# Patient Record
Sex: Female | Born: 1960 | Race: White | Hispanic: No | Marital: Married | Smoking: Former smoker
Health system: Southern US, Community
[De-identification: ages and names within clinical notes are randomized; demographics above are authoritative.]

## PROBLEM LIST (undated history)

## (undated) DIAGNOSIS — K219 Gastro-esophageal reflux disease without esophagitis: Secondary | ICD-10-CM

## (undated) DIAGNOSIS — E7800 Pure hypercholesterolemia, unspecified: Secondary | ICD-10-CM

## (undated) DIAGNOSIS — E785 Hyperlipidemia, unspecified: Secondary | ICD-10-CM

## (undated) DIAGNOSIS — T7840XA Allergy, unspecified, initial encounter: Secondary | ICD-10-CM

## (undated) DIAGNOSIS — Z87440 Personal history of urinary (tract) infections: Secondary | ICD-10-CM

## (undated) DIAGNOSIS — R011 Cardiac murmur, unspecified: Secondary | ICD-10-CM

## (undated) DIAGNOSIS — M1710 Unilateral primary osteoarthritis, unspecified knee: Secondary | ICD-10-CM

## (undated) DIAGNOSIS — M810 Age-related osteoporosis without current pathological fracture: Secondary | ICD-10-CM

## (undated) DIAGNOSIS — Z86010 Personal history of colonic polyps: Secondary | ICD-10-CM

## (undated) DIAGNOSIS — N63 Unspecified lump in unspecified breast: Secondary | ICD-10-CM

## (undated) DIAGNOSIS — Z8619 Personal history of other infectious and parasitic diseases: Secondary | ICD-10-CM

## (undated) DIAGNOSIS — M179 Osteoarthritis of knee, unspecified: Secondary | ICD-10-CM

## (undated) HISTORY — DX: Cardiac murmur, unspecified: R01.1

## (undated) HISTORY — DX: Hyperlipidemia, unspecified: E78.5

## (undated) HISTORY — DX: Unspecified lump in unspecified breast: N63.0

## (undated) HISTORY — DX: Gastro-esophageal reflux disease without esophagitis: K21.9

## (undated) HISTORY — DX: Personal history of colonic polyps: Z86.010

## (undated) HISTORY — DX: Pure hypercholesterolemia, unspecified: E78.00

## (undated) HISTORY — DX: Allergy, unspecified, initial encounter: T78.40XA

## (undated) HISTORY — DX: Personal history of other infectious and parasitic diseases: Z86.19

## (undated) HISTORY — DX: Osteoarthritis of knee, unspecified: M17.9

## (undated) HISTORY — DX: Personal history of urinary (tract) infections: Z87.440

## (undated) HISTORY — DX: Unilateral primary osteoarthritis, unspecified knee: M17.10

---
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
## 2000-01-12 ENCOUNTER — Encounter: Payer: Self-pay | Admitting: Sports Medicine

## 2000-01-12 ENCOUNTER — Encounter: Admission: RE | Admit: 2000-01-12 | Discharge: 2000-01-12 | Payer: Self-pay | Admitting: Sports Medicine

## 2000-01-24 ENCOUNTER — Ambulatory Visit (HOSPITAL_COMMUNITY): Admission: RE | Admit: 2000-01-24 | Discharge: 2000-01-24 | Payer: Self-pay | Admitting: Sports Medicine

## 2000-01-24 ENCOUNTER — Encounter: Payer: Self-pay | Admitting: Sports Medicine

## 2000-01-27 ENCOUNTER — Encounter: Admission: RE | Admit: 2000-01-27 | Discharge: 2000-01-27 | Payer: Self-pay | Admitting: Family Medicine

## 2000-10-20 ENCOUNTER — Encounter: Admission: RE | Admit: 2000-10-20 | Discharge: 2000-10-20 | Payer: Self-pay | Admitting: Sports Medicine

## 2001-10-03 ENCOUNTER — Other Ambulatory Visit: Admission: RE | Admit: 2001-10-03 | Discharge: 2001-10-03 | Payer: Self-pay | Admitting: Obstetrics and Gynecology

## 2002-03-19 ENCOUNTER — Encounter: Admission: RE | Admit: 2002-03-19 | Discharge: 2002-03-19 | Payer: Self-pay | Admitting: Family Medicine

## 2002-06-12 ENCOUNTER — Other Ambulatory Visit: Admission: RE | Admit: 2002-06-12 | Discharge: 2002-06-12 | Payer: Self-pay | Admitting: Obstetrics & Gynecology

## 2003-08-01 ENCOUNTER — Encounter: Admission: RE | Admit: 2003-08-01 | Discharge: 2003-08-01 | Payer: Self-pay | Admitting: Sports Medicine

## 2003-08-13 ENCOUNTER — Other Ambulatory Visit: Admission: RE | Admit: 2003-08-13 | Discharge: 2003-08-13 | Payer: Self-pay | Admitting: Obstetrics and Gynecology

## 2004-10-08 ENCOUNTER — Ambulatory Visit (HOSPITAL_COMMUNITY): Admission: RE | Admit: 2004-10-08 | Discharge: 2004-10-08 | Payer: Self-pay | Admitting: Obstetrics and Gynecology

## 2005-10-10 ENCOUNTER — Ambulatory Visit (HOSPITAL_COMMUNITY): Admission: RE | Admit: 2005-10-10 | Discharge: 2005-10-10 | Payer: Self-pay | Admitting: Obstetrics and Gynecology

## 2005-11-04 ENCOUNTER — Ambulatory Visit: Payer: Self-pay | Admitting: Sports Medicine

## 2006-02-22 ENCOUNTER — Encounter: Admission: RE | Admit: 2006-02-22 | Discharge: 2006-02-22 | Payer: Self-pay | Admitting: Obstetrics and Gynecology

## 2006-10-11 ENCOUNTER — Encounter: Admission: RE | Admit: 2006-10-11 | Discharge: 2006-10-11 | Payer: Self-pay | Admitting: Obstetrics and Gynecology

## 2007-03-26 ENCOUNTER — Ambulatory Visit (HOSPITAL_COMMUNITY): Admission: RE | Admit: 2007-03-26 | Discharge: 2007-03-26 | Payer: Self-pay | Admitting: Family Medicine

## 2007-03-26 ENCOUNTER — Encounter: Payer: Self-pay | Admitting: Sports Medicine

## 2007-10-16 ENCOUNTER — Encounter: Admission: RE | Admit: 2007-10-16 | Discharge: 2007-10-16 | Payer: Self-pay | Admitting: Obstetrics and Gynecology

## 2007-12-18 ENCOUNTER — Encounter: Admission: RE | Admit: 2007-12-18 | Discharge: 2007-12-18 | Payer: Self-pay | Admitting: Obstetrics and Gynecology

## 2008-01-24 ENCOUNTER — Encounter: Payer: Self-pay | Admitting: Family Medicine

## 2008-03-20 ENCOUNTER — Encounter: Payer: Self-pay | Admitting: Sports Medicine

## 2008-10-20 ENCOUNTER — Encounter: Admission: RE | Admit: 2008-10-20 | Discharge: 2008-10-20 | Payer: Self-pay | Admitting: Obstetrics and Gynecology

## 2009-02-04 ENCOUNTER — Encounter: Payer: Self-pay | Admitting: Family Medicine

## 2009-02-04 DIAGNOSIS — N63 Unspecified lump in unspecified breast: Secondary | ICD-10-CM | POA: Insufficient documentation

## 2009-03-30 ENCOUNTER — Encounter: Payer: Self-pay | Admitting: *Deleted

## 2009-04-09 ENCOUNTER — Encounter: Payer: Self-pay | Admitting: Family Medicine

## 2009-04-09 ENCOUNTER — Encounter: Admission: RE | Admit: 2009-04-09 | Discharge: 2009-04-09 | Payer: Self-pay | Admitting: Family Medicine

## 2009-04-30 ENCOUNTER — Ambulatory Visit: Payer: Self-pay | Admitting: Family Medicine

## 2009-05-25 ENCOUNTER — Ambulatory Visit: Payer: Self-pay | Admitting: Family Medicine

## 2009-05-26 ENCOUNTER — Ambulatory Visit (HOSPITAL_COMMUNITY): Admission: RE | Admit: 2009-05-26 | Discharge: 2009-05-26 | Payer: Self-pay | Admitting: Family Medicine

## 2009-05-27 ENCOUNTER — Encounter: Payer: Self-pay | Admitting: Family Medicine

## 2009-06-02 ENCOUNTER — Ambulatory Visit (HOSPITAL_COMMUNITY): Admission: RE | Admit: 2009-06-02 | Discharge: 2009-06-02 | Payer: Self-pay | Admitting: Family Medicine

## 2009-06-18 ENCOUNTER — Encounter: Payer: Self-pay | Admitting: Family Medicine

## 2009-09-25 ENCOUNTER — Encounter: Payer: Self-pay | Admitting: Family Medicine

## 2009-10-20 ENCOUNTER — Encounter: Payer: Self-pay | Admitting: Family Medicine

## 2009-11-04 ENCOUNTER — Encounter: Payer: Self-pay | Admitting: Family Medicine

## 2009-11-04 ENCOUNTER — Encounter: Admission: RE | Admit: 2009-11-04 | Discharge: 2009-11-04 | Payer: Self-pay | Admitting: Obstetrics and Gynecology

## 2010-06-25 ENCOUNTER — Encounter: Payer: Self-pay | Admitting: *Deleted

## 2010-06-30 ENCOUNTER — Encounter: Admission: RE | Admit: 2010-06-30 | Discharge: 2010-06-30 | Payer: Self-pay | Admitting: Family Medicine

## 2010-12-28 ENCOUNTER — Encounter
Admission: RE | Admit: 2010-12-28 | Discharge: 2010-12-28 | Payer: Self-pay | Source: Home / Self Care | Attending: Obstetrics and Gynecology | Admitting: Obstetrics and Gynecology

## 2010-12-29 ENCOUNTER — Encounter: Payer: Self-pay | Admitting: Family Medicine

## 2011-01-09 ENCOUNTER — Encounter: Payer: Self-pay | Admitting: Sports Medicine

## 2011-01-20 NOTE — Miscellaneous (Signed)
Summary: mammogram report (negative) entered.  Clinical Lists Changes  Observations: Added new observation of DM PROGRESS: N/A (12/29/2010 10:18) Added new observation of DM FSREVIEW: N/A (12/29/2010 10:18) Added new observation of HTN PROGRESS: N/A (12/29/2010 10:18) Added new observation of HTN FSREVIEW: N/A (12/29/2010 10:18) Added new observation of LIPID PROGRS: N/A (12/29/2010 10:18) Added new observation of LIPID FSREVW: N/A (12/29/2010 10:18) Added new observation of MAMMOGRAM: No specific mammographic evidence of malignancy.  Assessment: BIRADS 1.  (12/28/2010 10:19)      Mammogram  Procedure date:  12/28/2010  Findings:      No specific mammographic evidence of malignancy.  Assessment: BIRADS 1.     Prevention & Chronic Care Immunizations   Influenza vaccine: Not documented    Tetanus booster: 04/30/2009: Tdap    Pneumococcal vaccine: Not documented  Colorectal Screening   Hemoccult: Not documented    Colonoscopy: Not documented  Other Screening   Pap smear: Not documented    Mammogram: No specific mammographic evidence of malignancy.  Assessment: BIRADS 1.   (12/28/2010)   Smoking status: Not documented  Lipids   Total Cholesterol: Not documented   LDL: Not documented   LDL Direct: Not documented   HDL: Not documented   Triglycerides: Not documented

## 2011-01-20 NOTE — Miscellaneous (Signed)
Summary: orders  Clinical Lists Changes Pt noted 2 small cysts L breasts that have persisted.  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  No breast discharge.  + Occ pain at area of cysts with frequent working out and lifting. PE: Well noursished, well developed.  No distress R breast with diffuse fibrocystic type changes as noted before.  + 0.5 cm firm nodule approx 2 cm from nipple in 1 o'clock position.  Second nodule noted in same breast 1cm from nipple in 11 o'clock position.  A/P: cystic appearing nodules in pt with multiple episodes of breast imaging.  Discussed options with pt, and we will proceed with imaging.  Dx mammogram and ultrasound if indicated. Sarah Swaziland MD  June 25, 2010 1:52 PM  Orders: Added new Test order of Mammogram (Diagnostic) (Mammo) - Signed

## 2011-06-23 ENCOUNTER — Other Ambulatory Visit: Payer: Self-pay | Admitting: Family Medicine

## 2011-06-23 DIAGNOSIS — K219 Gastro-esophageal reflux disease without esophagitis: Secondary | ICD-10-CM

## 2011-06-23 MED ORDER — ESOMEPRAZOLE MAGNESIUM 40 MG PO CPDR: 40.0000 mg | DELAYED_RELEASE_CAPSULE | Freq: Every day | ORAL | Status: DC

## 2011-10-04 ENCOUNTER — Other Ambulatory Visit: Payer: Self-pay | Admitting: Family Medicine

## 2011-10-04 DIAGNOSIS — K219 Gastro-esophageal reflux disease without esophagitis: Secondary | ICD-10-CM

## 2011-10-04 MED ORDER — ESOMEPRAZOLE MAGNESIUM 40 MG PO CPDR: 40.0000 mg | DELAYED_RELEASE_CAPSULE | Freq: Every day | ORAL | Status: DC

## 2011-10-04 MED ORDER — OLANZAPINE 5 MG PO TABS: 5.0000 mg | ORAL_TABLET | Freq: Every day | ORAL | Status: DC

## 2011-10-04 MED ORDER — FLUOXETINE HCL 20 MG PO CAPS: 20.0000 mg | ORAL_CAPSULE | Freq: Every day | ORAL | Status: DC

## 2011-10-06 ENCOUNTER — Other Ambulatory Visit: Payer: Self-pay | Admitting: Family Medicine

## 2011-10-06 DIAGNOSIS — R5383 Other fatigue: Secondary | ICD-10-CM

## 2011-10-07 LAB — TSH: TSH: 3.915 u[IU]/mL (ref 0.350–4.500)

## 2011-10-11 LAB — TSH: TSH: 3.915 u[IU]/mL (ref 0.350–4.500)

## 2011-10-31 ENCOUNTER — Other Ambulatory Visit: Payer: Self-pay | Admitting: Family Medicine

## 2011-10-31 MED ORDER — FLUOXETINE HCL 20 MG PO CAPS: 20.0000 mg | ORAL_CAPSULE | Freq: Every day | ORAL | Status: DC

## 2011-10-31 MED ORDER — OLANZAPINE 5 MG PO TABS: 5.0000 mg | ORAL_TABLET | Freq: Every day | ORAL | Status: DC

## 2011-12-02 ENCOUNTER — Ambulatory Visit (INDEPENDENT_AMBULATORY_CARE_PROVIDER_SITE_OTHER): Payer: 59 | Admitting: Family Medicine

## 2011-12-02 DIAGNOSIS — J309 Allergic rhinitis, unspecified: Secondary | ICD-10-CM

## 2011-12-02 DIAGNOSIS — J329 Chronic sinusitis, unspecified: Secondary | ICD-10-CM

## 2011-12-02 MED ORDER — FLUTICASONE PROPIONATE 50 MCG/ACT NA SUSP: 2.0000 | Freq: Every day | NASAL | Status: DC

## 2011-12-02 MED ORDER — CETIRIZINE HCL 10 MG PO CAPS: 10.0000 mg | ORAL_CAPSULE | Freq: Every day | ORAL | Status: DC

## 2011-12-02 NOTE — Patient Instructions (Signed)
Allergic Rhinitis Allergic rhinitis is when the mucous membranes in the nose respond to allergens. Allergens are particles in the air that cause your body to have an allergic reaction. This causes you to release allergic antibodies. Through a chain of events, these eventually cause you to release histamine into the blood stream (hence the use of antihistamines). Although meant to be protective to the body, it is this release that causes your discomfort, such as frequent sneezing, congestion and an itchy runny nose.  CAUSES  The pollen allergens may come from grasses, trees, and weeds. This is seasonal allergic rhinitis, or "hay fever." Other allergens cause year-round allergic rhinitis (perennial allergic rhinitis) such as house dust mite allergen, pet dander and mold spores.  SYMPTOMS   Nasal stuffiness (congestion).   Runny, itchy nose with sneezing and tearing of the eyes.   There is often an itching of the mouth, eyes and ears.  It cannot be cured, but it can be controlled with medications. DIAGNOSIS  If you are unable to determine the offending allergen, skin or blood testing may find it. TREATMENT   Avoid the allergen.   Medications and allergy shots (immunotherapy) can help.   Hay fever may often be treated with antihistamines in pill or nasal spray forms. Antihistamines block the effects of histamine. There are over-the-counter medicines that may help with nasal congestion and swelling around the eyes. Check with your caregiver before taking or giving this medicine.  If the treatment above does not work, there are many new medications your caregiver can prescribe. Stronger medications may be used if initial measures are ineffective. Desensitizing injections can be used if medications and avoidance fails. Desensitization is when a patient is given ongoing shots until the body becomes less sensitive to the allergen. Make sure you follow up with your caregiver if problems continue. SEEK  MEDICAL CARE IF:   You develop fever (more than 100.5 F (38.1 C).   You develop a cough that does not stop easily (persistent).   You have shortness of breath.   You start wheezing.   Symptoms interfere with normal daily activities.  Document Released: 08/30/2001 Document Revised: 08/17/2011 Document Reviewed: 03/11/2009 [employer] Patient Information 2012 [employer]. 

## 2011-12-03 DIAGNOSIS — J329 Chronic sinusitis, unspecified: Secondary | ICD-10-CM | POA: Insufficient documentation

## 2011-12-03 HISTORY — DX: Chronic sinusitis, unspecified: J32.9

## 2011-12-03 NOTE — Progress Notes (Signed)
  Subjective:    Patient ID: Allison Calderon, female    DOB: 08-20-1961, 50 y.o.   MRN: [id number]  HPI Sinus congestion x 1 week. No fevers. Mild rhinorrhea and post nasal drip. Pt states that she has had episodes of this before in the past and this has been relieved with flonase. Pt states that she has also noticed increased snoring and difficulty breathing through nose, especially at night over this same time frame. No hx/o asthma. Non smoker. Pt report hx/o seasonal allergies. 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 No generalized malaise or fatigue.    Review of Systems See HPI, otherwise ROS, negative.     Objective:   Physical Exam Gen: up in chair, NAD HEENT: NCAT, EOMI, TMs clear bilaterally, +nasal erythema and mild turbinate swelling  bilaterally, + post mild oropharyngeal erythema  CV: RRR, no murmurs auscultated PULM: CTAB, no wheezes, rales, rhoncii   Assessment & Plan:

## 2011-12-03 NOTE — Assessment & Plan Note (Signed)
Will start pt on flonase and zyrtec for this as there seems to be an allergic component to sxs. Likely nidus for this may have been a mild virus.  Handout given. Will follow prn.

## 2012-01-02 ENCOUNTER — Other Ambulatory Visit: Payer: Self-pay | Admitting: Family Medicine

## 2012-01-02 DIAGNOSIS — K219 Gastro-esophageal reflux disease without esophagitis: Secondary | ICD-10-CM

## 2012-01-02 MED ORDER — ESOMEPRAZOLE MAGNESIUM 40 MG PO CPDR: 40.0000 mg | DELAYED_RELEASE_CAPSULE | Freq: Every day | ORAL | Status: DC

## 2012-01-23 ENCOUNTER — Other Ambulatory Visit: Payer: Self-pay | Admitting: Family Medicine

## 2012-01-23 DIAGNOSIS — Z1231 Encounter for screening mammogram for malignant neoplasm of breast: Secondary | ICD-10-CM

## 2012-02-06 ENCOUNTER — Other Ambulatory Visit: Payer: Self-pay | Admitting: Family Medicine

## 2012-02-06 MED ORDER — FLUOXETINE HCL 20 MG PO CAPS: 20.0000 mg | ORAL_CAPSULE | Freq: Every day | ORAL | Status: DC

## 2012-02-06 MED ORDER — OLANZAPINE 5 MG PO TABS: 5.0000 mg | ORAL_TABLET | Freq: Every day | ORAL | Status: DC

## 2012-02-07 ENCOUNTER — Ambulatory Visit
Admission: RE | Admit: 2012-02-07 | Discharge: 2012-02-07 | Disposition: A | Discharge status: Home / Self Care | Payer: 59 | Source: Ambulatory Visit | Attending: Family Medicine | Admitting: Family Medicine

## 2012-02-07 DIAGNOSIS — Z1231 Encounter for screening mammogram for malignant neoplasm of breast: Secondary | ICD-10-CM

## 2012-03-07 ENCOUNTER — Other Ambulatory Visit: Payer: Self-pay | Admitting: Family Medicine

## 2012-05-17 ENCOUNTER — Other Ambulatory Visit (INDEPENDENT_AMBULATORY_CARE_PROVIDER_SITE_OTHER): Payer: 59 | Admitting: Family Medicine

## 2012-05-17 ENCOUNTER — Other Ambulatory Visit: Payer: Self-pay | Admitting: Family Medicine

## 2012-05-17 DIAGNOSIS — N390 Urinary tract infection, site not specified: Secondary | ICD-10-CM

## 2012-05-17 DIAGNOSIS — R300 Dysuria: Secondary | ICD-10-CM

## 2012-05-17 LAB — POCT URINALYSIS DIPSTICK
Bilirubin, UA: NEGATIVE
Glucose, UA: NEGATIVE
Nitrite, UA: NEGATIVE
Urobilinogen, UA: 0.2

## 2012-05-17 LAB — POCT UA - MICROSCOPIC ONLY: RBC, urine, microscopic: >20

## 2012-05-17 MED ORDER — CEPHALEXIN 500 MG PO TABS: 500.0000 mg | ORAL_TABLET | Freq: Two times a day (BID) | ORAL | Status: AC

## 2012-05-17 NOTE — Progress Notes (Unsigned)
Urine & microscopic done, urine sent for culture, per Dr. Domenica Reamer, MLS

## 2012-05-17 NOTE — Progress Notes (Signed)
UTI sx since yesterday. Has these once or twice a year---usu resolve w fluids alone. Has very occ had blood tinged urine and she is having that now. No fever, no nausea, no back or abdominal pain. + urinary frequency and small amounts, blood tinged.  Will do UA, cx, start keflex 7 days.

## 2012-05-19 LAB — URINE CULTURE: Organism ID, Bacteria: NO GROWTH

## 2012-12-14 ENCOUNTER — Encounter: Payer: Self-pay | Admitting: Internal Medicine

## 2012-12-14 ENCOUNTER — Ambulatory Visit (INDEPENDENT_AMBULATORY_CARE_PROVIDER_SITE_OTHER): Payer: 59 | Admitting: Internal Medicine

## 2012-12-14 VITALS — BP 112/82 | HR 75 | Temp 98.0°F | Ht 62.0 in | Wt 128.1 lb

## 2012-12-14 DIAGNOSIS — Z0000 Encounter for general adult medical examination without abnormal findings: Secondary | ICD-10-CM

## 2012-12-14 DIAGNOSIS — Z1211 Encounter for screening for malignant neoplasm of colon: Secondary | ICD-10-CM

## 2012-12-14 NOTE — Progress Notes (Signed)
  Subjective:    Patient ID: Allison Calderon, female    DOB: 1961-10-06, 51 y.o.   MRN: [id number]  HPI  patient is here today for annual physical. Patient feels well and has no complaints. Past Medical History  Diagnosis Date  . BREAST LUMP   . History of chicken pox   . GERD (gastroesophageal reflux disease)   . Allergy   . Hx: UTI (urinary tract infection)    Family History  Problem Relation Age of Onset  . Arthritis Mother   . Diabetes Mother   . Hyperlipidemia Father   . Heart disease Father     MI @ 36  . Hypertension Paternal Grandfather    History  Substance Use Topics  . Smoking status: Former Smoker -- 30 years    Types: Cigarettes  . Smokeless tobacco: Never Used  . Alcohol Use: Yes    Review of Systems Constitutional: Negative for fever or weight change.  Respiratory: Negative for cough and shortness of breath.   Cardiovascular: Negative for chest pain or palpitations.  Gastrointestinal: Negative for abdominal pain, no bowel changes.  Musculoskeletal: Negative for gait problem or joint swelling.  Skin: Negative for rash.  Neurological: Negative for dizziness or headache.  No other specific complaints in a complete review of systems (except as listed in HPI above).     Objective:   Physical Exam BP 112/82  Pulse 75  Temp 98 F (36.7 C) (Oral)  Ht 5\' 2"  (1.575 m)  Wt 128 lb 1.9 oz (58.115 kg)  BMI 23.43 kg/m2  SpO2 98% Wt Readings from Last 3 Encounters:  12/14/12 128 lb 1.9 oz (58.115 kg)  12/02/11 122 lb (55.339 kg)  05/25/09 125 lb (56.7 kg)   Constitutional: She appears well-developed and well-nourished. No distress.  HENT: Head: Normocephalic and atraumatic. Ears: B TMs ok, no erythema or effusion; Nose: Nose normal.  Mouth/Throat: Oropharynx is clear and moist. No oropharyngeal exudate.  Eyes: Conjunctivae and EOM are normal. Pupils are equal, round, and reactive to light. No scleral icterus.  Neck: Normal range of motion. Neck supple. No JVD  present. No thyromegaly present.  Cardiovascular: Normal rate, regular rhythm and normal heart sounds.  No murmur heard. No BLE edema. Pulmonary/Chest: Effort normal and breath sounds normal. No respiratory distress. She has no wheezes.  Abdominal: Soft. Bowel sounds are normal. She exhibits no distension. There is no tenderness. no masses Musculoskeletal: Normal range of motion, no joint effusions. No gross deformities Neurological: She is alert and oriented to person, place, and time. No cranial nerve deficit. Coordination normal.  Skin: Skin is warm and dry. No rash noted. No erythema.  Psychiatric: She has a normal mood and affect. Her behavior is normal. Judgment and thought content normal.   Lab Results  Component Value Date   TSH 3.915 10/06/2011   TSH 3.915 10/06/2011        Assessment & Plan:  CPx/v70.0 - Patient has been counseled on age-appropriate routine health concerns for screening and prevention. These are reviewed and up-to-date. Immunizations are up-to-date or declined. Labs orderd and reviewed.

## 2012-12-14 NOTE — Patient Instructions (Addendum)
It was good to see you today. Health Maintenance reviewed - all recommended immunizations and age-appropriate screenings are up-to-date. we'll make referral to GI for screening colonoscopy . Our office will contact you regarding appointment(s) once made. Test(s) ordered today. Your results will be released to MyChart (or called to you) after review, usually within 72hours after test completion. If any changes need to be made, you will be notified at that same time. Medications reviewed, no changes at this time.  Please schedule followup in 1 year for medical physical, call sooner if problems. Health Maintenance, Females A healthy lifestyle and preventative care can promote health and wellness.  Maintain regular health, dental, and eye exams.   Eat a healthy diet. Foods like vegetables, fruits, whole grains, low-fat dairy products, and lean protein foods contain the nutrients you need without too many calories. Decrease your intake of foods high in solid fats, added sugars, and salt. Get information about a proper diet from your caregiver, if necessary.   Regular physical exercise is one of the most important things you can do for your health. Most adults should get at least 150 minutes of moderate-intensity exercise (any activity that increases your heart rate and causes you to sweat) each week. In addition, most adults need muscle-strengthening exercises on 2 or more days a week.     Maintain a healthy weight. The body mass index (BMI) is a screening tool to identify possible weight problems. It provides an estimate of body fat based on height and weight. Your caregiver can help determine your BMI, and can help you achieve or maintain a healthy weight. For adults 20 years and older:   A BMI below 18.5 is considered underweight.   A BMI of 18.5 to 24.9 is normal.   A BMI of 25 to 29.9 is considered overweight.   A BMI of 30 and above is considered obese.   Maintain normal blood lipids and  cholesterol by exercising and minimizing your intake of saturated fat. Eat a balanced diet with plenty of fruits and vegetables. Blood tests for lipids and cholesterol should begin at age 17 and be repeated every 5 years. If your lipid or cholesterol levels are high, you are over 50, or you are a high risk for heart disease, you may need your cholesterol levels checked more frequently. Ongoing high lipid and cholesterol levels should be treated with medicines if diet and exercise are not effective.   If you smoke, find out from your caregiver how to quit. If you do not use tobacco, do not start.   If you are pregnant, do not drink alcohol. If you are breastfeeding, be very cautious about drinking alcohol. If you are not pregnant and choose to drink alcohol, do not exceed 1 drink per day. One drink is considered to be 12 ounces (355 mL) of beer, 5 ounces (148 mL) of wine, or 1.5 ounces (44 mL) of liquor.   Avoid use of street drugs. Do not share needles with anyone. Ask for help if you need support or instructions about stopping the use of drugs.   High blood pressure causes heart disease and increases the risk of stroke. Blood pressure should be checked at least every 1 to 2 years. Ongoing high blood pressure should be treated with medicines, if weight loss and exercise are not effective.   If you are 35 to 53 years old, ask your caregiver if you should take aspirin to prevent strokes.   Diabetes screening involves taking a  blood sample to check your fasting blood sugar level. This should be done once every 3 years, after age 17, if you are within normal weight and without risk factors for diabetes. Testing should be considered at a younger age or be carried out more frequently if you are overweight and have at least 1 risk factor for diabetes.   Breast cancer screening is essential preventative care for women. You should practice "breast self-awareness." This means understanding the normal appearance  and feel of your breasts and may include breast self-examination. Any changes detected, no matter how small, should be reported to a caregiver. Women in their 55s and 30s should have a clinical breast exam (CBE) by a caregiver as part of a regular health exam every 1 to 3 years. After age 30, women should have a CBE every year. Starting at age 73, women should consider having a mammogram (breast X-ray) every year. Women who have a family history of breast cancer should talk to their caregiver about genetic screening. Women at a high risk of breast cancer should talk to their caregiver about having an MRI and a mammogram every year.   The Pap test is a screening test for cervical cancer. Women should have a Pap test starting at age 68. Between ages 11 and 74, Pap tests should be repeated every 2 years. Beginning at age 7, you should have a Pap test every 3 years as long as the past 3 Pap tests have been normal. If you had a hysterectomy for a problem that was not cancer or a condition that could lead to cancer, then you no longer need Pap tests. If you are between ages 78 and 25, and you have had normal Pap tests going back 10 years, you no longer need Pap tests. If you have had past treatment for cervical cancer or a condition that could lead to cancer, you need Pap tests and screening for cancer for at least 20 years after your treatment. If Pap tests have been discontinued, risk factors (such as a new sexual partner) need to be reassessed to determine if screening should be resumed. Some women have medical problems that increase the chance of getting cervical cancer. In these cases, your caregiver may recommend more frequent screening and Pap tests.   The human papillomavirus (HPV) test is an additional test that may be used for cervical cancer screening. The HPV test looks for the virus that can cause the cell changes on the cervix. The cells collected during the Pap test can be tested for HPV. The HPV test  could be used to screen women aged 15 years and older, and should be used in women of any age who have unclear Pap test results. After the age of 17, women should have HPV testing at the same frequency as a Pap test.   Colorectal cancer can be detected and often prevented. Most routine colorectal cancer screening begins at the age of 79 and continues through age 57. However, your caregiver may recommend screening at an earlier age if you have risk factors for colon cancer. On a yearly basis, your caregiver may provide home test kits to check for hidden blood in the stool. Use of a small camera at the end of a tube, to directly examine the colon (sigmoidoscopy or colonoscopy), can detect the earliest forms of colorectal cancer. Talk to your caregiver about this at age 20, when routine screening begins. Direct examination of the colon should be repeated every 5 to  10 years through age 48, unless early forms of pre-cancerous polyps or small growths are found.   Hepatitis C blood testing is recommended for all people born from [birth year] through 1965 and any individual with known risks for hepatitis C.   Practice safe sex. Use condoms and avoid high-risk sexual practices to reduce the spread of sexually transmitted infections (STIs). Sexually active women aged 20 and younger should be checked for Chlamydia, which is a common sexually transmitted infection. Older women with new or multiple partners should also be tested for Chlamydia. Testing for other STIs is recommended if you are sexually active and at increased risk.   Osteoporosis is a disease in which the bones lose minerals and strength with aging. This can result in serious bone fractures. The risk of osteoporosis can be identified using a bone density scan. Women ages 32 and over and women at risk for fractures or osteoporosis should discuss screening with their caregivers. Ask your caregiver whether you should be taking a calcium supplement or vitamin D to  reduce the rate of osteoporosis.   Menopause can be associated with physical symptoms and risks. Hormone replacement therapy is available to decrease symptoms and risks. You should talk to your caregiver about whether hormone replacement therapy is right for you.   Use sunscreen with a sun protection factor (SPF) of 30 or greater. Apply sunscreen liberally and repeatedly throughout the day. You should seek shade when your shadow is shorter than you. Protect yourself by wearing long sleeves, pants, a wide-brimmed hat, and sunglasses year round, whenever you are outdoors.   Notify your caregiver of new moles or changes in moles, especially if there is a change in shape or color. Also notify your caregiver if a mole is larger than the size of a pencil eraser.   Stay current with your immunizations.  Document Released: 06/20/2011 Document Revised: 02/27/2012 Document Reviewed: 06/20/2011 [facility] Patient Information 2013 [place], [place].

## 2013-01-16 ENCOUNTER — Encounter: Payer: Self-pay | Admitting: Internal Medicine

## 2013-01-16 ENCOUNTER — Other Ambulatory Visit: Payer: Self-pay | Admitting: Internal Medicine

## 2013-01-16 DIAGNOSIS — Z1231 Encounter for screening mammogram for malignant neoplasm of breast: Secondary | ICD-10-CM

## 2013-01-18 ENCOUNTER — Encounter: Payer: Self-pay | Admitting: Internal Medicine

## 2013-01-18 ENCOUNTER — Other Ambulatory Visit (INDEPENDENT_AMBULATORY_CARE_PROVIDER_SITE_OTHER): Payer: 59

## 2013-01-18 DIAGNOSIS — Z0000 Encounter for general adult medical examination without abnormal findings: Secondary | ICD-10-CM

## 2013-01-18 LAB — URINALYSIS, ROUTINE W REFLEX MICROSCOPIC
Bilirubin Urine: NEGATIVE
Ketones, ur: NEGATIVE
Leukocytes, UA: NEGATIVE
Total Protein, Urine: NEGATIVE
Urine Glucose: NEGATIVE
pH: 6 (ref 5.0–8.0)

## 2013-01-18 LAB — LIPID PANEL
HDL: 80.7 mg/dL (ref >39.00)
Total CHOL/HDL Ratio: 3
Triglycerides: 70 mg/dL (ref 0.0–149.0)
VLDL: 14 mg/dL (ref 0.0–40.0)

## 2013-01-18 LAB — BASIC METABOLIC PANEL
CO2: 25 mEq/L (ref 19–32)
GFR: 76.32 mL/min (ref >60.00)
Glucose, Bld: 102 mg/dL — ABNORMAL HIGH (ref 70–99)
Potassium: 4.5 mEq/L (ref 3.5–5.1)
Sodium: 137 mEq/L (ref 135–145)

## 2013-01-18 LAB — CBC WITH DIFFERENTIAL/PLATELET
Basophils Absolute: 0.1 10*3/uL (ref 0.0–0.1)
HCT: 39.7 % (ref 36.0–46.0)
Hemoglobin: 13.6 g/dL (ref 12.0–15.0)
Lymphs Abs: 1.9 10*3/uL (ref 0.7–4.0)
Monocytes Relative: 10.2 % (ref 3.0–12.0)
Neutro Abs: 3.8 10*3/uL (ref 1.4–7.7)
RDW: 12.7 % (ref 11.5–14.6)

## 2013-01-18 LAB — HEPATIC FUNCTION PANEL: Total Bilirubin: 1.2 mg/dL (ref 0.3–1.2)

## 2013-01-18 LAB — LDL CHOLESTEROL, DIRECT: Direct LDL: 135.9 mg/dL

## 2013-02-11 ENCOUNTER — Ambulatory Visit
Admission: RE | Admit: 2013-02-11 | Discharge: 2013-02-11 | Disposition: A | Discharge status: Home / Self Care | Payer: 59 | Source: Ambulatory Visit | Attending: Internal Medicine | Admitting: Internal Medicine

## 2013-02-11 DIAGNOSIS — Z1231 Encounter for screening mammogram for malignant neoplasm of breast: Secondary | ICD-10-CM

## 2013-02-14 ENCOUNTER — Encounter: Payer: 59 | Admitting: Internal Medicine

## 2013-02-16 HISTORY — PX: COLONOSCOPY: SHX174

## 2013-02-18 ENCOUNTER — Ambulatory Visit (AMBULATORY_SURGERY_CENTER): Payer: 59 | Admitting: *Deleted

## 2013-02-18 VITALS — Ht 62.0 in | Wt 129.6 lb

## 2013-02-18 DIAGNOSIS — Z1211 Encounter for screening for malignant neoplasm of colon: Secondary | ICD-10-CM

## 2013-02-18 MED ORDER — NA SULFATE-K SULFATE-MG SULF 17.5-3.13-1.6 GM/177ML PO SOLN: 1.0000 | Freq: Once | ORAL | Status: DC

## 2013-02-18 NOTE — Progress Notes (Signed)
No egg or soy allergy. ewm 

## 2013-03-05 ENCOUNTER — Telehealth: Payer: Self-pay | Admitting: *Deleted

## 2013-03-05 NOTE — Telephone Encounter (Signed)
Error in charting.  No call made to this pt.

## 2013-03-06 ENCOUNTER — Encounter: Payer: Self-pay | Admitting: Internal Medicine

## 2013-03-06 ENCOUNTER — Ambulatory Visit (AMBULATORY_SURGERY_CENTER): Payer: 59 | Admitting: Internal Medicine

## 2013-03-06 VITALS — BP 97/57 | HR 64 | Temp 99.2°F | Resp 19 | Ht 62.0 in | Wt 129.0 lb

## 2013-03-06 DIAGNOSIS — Z1211 Encounter for screening for malignant neoplasm of colon: Secondary | ICD-10-CM

## 2013-03-06 DIAGNOSIS — D126 Benign neoplasm of colon, unspecified: Secondary | ICD-10-CM

## 2013-03-06 MED ORDER — SODIUM CHLORIDE 0.9 % IV SOLN: 500.0000 mL | INTRAVENOUS | Status: DC

## 2013-03-06 NOTE — Op Note (Signed)
[facility] [address] [place], [address]   COLONOSCOPY PROCEDURE REPORT  PATIENT: Allison Calderon, Allison Calderon  MR#: [id number] BIRTHDATE: 1961/12/17 , 51  yrs. old GENDER: Female ENDOSCOPIST: Iva Boop, MD, [facility] REFERRED [id number] Felicity Coyer, M.D. PROCEDURE DATE:  03/06/2013 PROCEDURE:   Colonoscopy with snare polypectomy ASA CLASS:   Class I INDICATIONS:average risk screening. MEDICATIONS: propofol (Diprivan) 400mg  IV, MAC sedation, administered by CRNA, and These medications were titrated to patient response per physician's verbal order  DESCRIPTION OF PROCEDURE:   After the risks benefits and alternatives of the procedure were thoroughly explained, informed consent was obtained.  A digital rectal exam revealed no abnormalities of the rectum.   The LB [id number]-[id number]  endoscope was introduced through the anus and advanced to the cecum, which was identified by both the appendix and ileocecal valve. No adverse events experienced.   The quality of the prep was Suprep excellent The instrument was then slowly withdrawn as the colon was fully examined.      COLON FINDINGS: Two polypoid shaped sessile polyps ranging between 3-46mm in size were found at the cecum and in the descending colon. A polypectomy was performed with a cold snare.  The resection was complete and the polyp tissue was completely retrieved.   The colon mucosa was otherwise normal.   A right colon retroflexion was performed.  Retroflexed views revealed no abnormalities. The time to cecum=7 minutes 0 seconds.  Withdrawal time=9 minutes 37 seconds.  The scope was withdrawn and the procedure completed. COMPLICATIONS: There were no complications.  ENDOSCOPIC IMPRESSION: 1.   Two sessile polyps ranging between 3-98mm in size were found at the cecum and in the descending colon; polypectomy was performed with a cold snare 2.   The colon mucosa was otherwise normal - excellent  prep  RECOMMENDATIONS: Timing of repeat colonoscopy will be determined by pathology findings.   eSigned:  Iva Boop, MD, [facility] 03/06/2013 11:50 AM  cc: Newt Lukes, MD and The Patient

## 2013-03-06 NOTE — Progress Notes (Signed)
Patient did not experience any of the following events: a burn prior to discharge; a fall within the facility; wrong site/side/patient/procedure/implant event; or a hospital transfer or hospital admission upon discharge from the facility. (G8907) Patient did not have preoperative order for IV antibiotic SSI prophylaxis. (G8918)  

## 2013-03-06 NOTE — Patient Instructions (Addendum)
Two very small benign-appearing polyps were removed. Everything else looked normal.  I will let you know pathology results and when to have another routine colonoscopy by mail.  Thank you for choosing me and [facility].  Iva Boop, MD, FACG  YOU HAD AN ENDOSCOPIC PROCEDURE TODAY AT THE [facility]: Refer to the procedure report that was given to you for any specific questions about what was found during the examination.  If the procedure report does not answer your questions, please call your gastroenterologist to clarify.  If you requested that your care partner not be given the details of your procedure findings, then the procedure report has been included in a sealed envelope for you to review at your convenience later.  YOU SHOULD EXPECT: Some feelings of bloating in the abdomen. Passage of more gas than usual.  Walking can help get rid of the air that was put into your GI tract during the procedure and reduce the bloating. If you had a lower endoscopy (such as a colonoscopy or flexible sigmoidoscopy) you may notice spotting of blood in your stool or on the toilet paper. If you underwent a bowel prep for your procedure, then you may not have a normal bowel movement for a few days.  DIET: Your first meal following the procedure should be a light meal and then it is ok to progress to your normal diet.  A half-sandwich or bowl of soup is an example of a good first meal.  Heavy or fried foods are harder to digest and may make you feel nauseous or bloated.  Likewise meals heavy in dairy and vegetables can cause extra gas to form and this can also increase the bloating.  Drink plenty of fluids but you should avoid alcoholic beverages for 24 hours.  ACTIVITY: Your care partner should take you home directly after the procedure.  You should plan to take it easy, moving slowly for the rest of the day.  You can resume normal activity the day after the procedure however you  should NOT DRIVE or use heavy machinery for 24 hours (because of the sedation medicines used during the test).    SYMPTOMS TO REPORT IMMEDIATELY: A gastroenterologist can be reached at any hour.  During normal business hours, 8:30 AM to 5:00 PM Monday through Friday, call [phone].  After hours and on weekends, please call the GI answering service at [phone] who will take a message and have the physician on call contact you.   Following lower endoscopy (colonoscopy or flexible sigmoidoscopy):  Excessive amounts of blood in the stool  Significant tenderness or worsening of abdominal pains  Swelling of the abdomen that is new, acute  Fever of 100F or higher  FOLLOW UP: If any biopsies were taken you will be contacted by phone or by letter within the next 1-3 weeks.  Call your gastroenterologist if you have not heard about the biopsies in 3 weeks.  Our staff will call the home number listed on your records the next business day following your procedure to check on you and address any questions or concerns that you may have at that time regarding the information given to you following your procedure. This is a courtesy call and so if there is no answer at the home number and we have not heard from you through the emergency physician on call, we will assume that you have returned to your regular daily activities without incident.  SIGNATURES/CONFIDENTIALITY: You and/or your care  partner have signed paperwork which will be entered into your electronic medical record.  These signatures attest to the fact that that the information above on your After Visit Summary has been reviewed and is understood.  Full responsibility of the confidentiality of this discharge information lies with you and/or your care-partner.   Polyp-handout given  Pathology will determine when repeat colonoscopy will be

## 2013-03-06 NOTE — Progress Notes (Signed)
Called to room to assist during endoscopic procedure.  Patient ID and intended procedure confirmed with present staff. Received instructions for my participation in the procedure from the performing physician.  

## 2013-03-07 ENCOUNTER — Telehealth: Payer: Self-pay

## 2013-03-07 NOTE — Telephone Encounter (Signed)
  Follow up Call-  Call back number 03/06/2013  Post procedure Call Back phone  # [phone]  Permission to leave phone message Yes     Patient questions:  Do you have a fever, pain , or abdominal swelling? no Pain Score  0 *  Have you tolerated food without any problems? yes  Have you been able to return to your normal activities? yes  Do you have any questions about your discharge instructions: Diet   no Medications  no Follow up visit  no  Do you have questions or concerns about your Care? no  Actions: * If pain score is 4 or above: No action needed, pain <4.

## 2013-03-11 ENCOUNTER — Encounter: Payer: Self-pay | Admitting: Internal Medicine

## 2013-03-11 NOTE — Telephone Encounter (Signed)
Phone note entered in error 

## 2013-03-13 ENCOUNTER — Encounter: Payer: Self-pay | Admitting: Internal Medicine

## 2013-03-13 DIAGNOSIS — Z860100 Personal history of colon polyps, unspecified: Secondary | ICD-10-CM

## 2013-03-13 HISTORY — DX: Personal history of colonic polyps: Z86.010

## 2013-03-13 HISTORY — DX: Personal history of colon polyps, unspecified: Z86.0100

## 2013-03-13 NOTE — Progress Notes (Signed)
Quick Note:  2 diminutive adenomas Repeat colonoscopy about 02/2018 ______

## 2013-03-18 NOTE — Telephone Encounter (Signed)
In error

## 2013-05-20 ENCOUNTER — Other Ambulatory Visit: Payer: Self-pay | Admitting: *Deleted

## 2013-05-20 MED ORDER — PANTOPRAZOLE SODIUM 40 MG PO TBEC: 40.0000 mg | DELAYED_RELEASE_TABLET | Freq: Every day | ORAL | Status: DC

## 2013-10-24 ENCOUNTER — Other Ambulatory Visit: Payer: Self-pay

## 2013-11-22 ENCOUNTER — Other Ambulatory Visit: Payer: Self-pay | Admitting: *Deleted

## 2013-11-22 ENCOUNTER — Encounter: Payer: Self-pay | Admitting: Internal Medicine

## 2013-11-22 MED ORDER — PANTOPRAZOLE SODIUM 40 MG PO TBEC: 40.0000 mg | DELAYED_RELEASE_TABLET | Freq: Every day | ORAL | Status: DC

## 2013-11-22 NOTE — Telephone Encounter (Signed)
Pt sent email requesting refill on her protonix...Allison Calderon

## 2013-12-24 ENCOUNTER — Other Ambulatory Visit (INDEPENDENT_AMBULATORY_CARE_PROVIDER_SITE_OTHER): Payer: 59

## 2013-12-24 ENCOUNTER — Encounter: Payer: Self-pay | Admitting: Internal Medicine

## 2013-12-24 ENCOUNTER — Ambulatory Visit (INDEPENDENT_AMBULATORY_CARE_PROVIDER_SITE_OTHER): Payer: 59 | Admitting: Internal Medicine

## 2013-12-24 VITALS — BP 102/68 | HR 64 | Temp 97.9°F | Ht 62.0 in | Wt 125.8 lb

## 2013-12-24 DIAGNOSIS — Z0000 Encounter for general adult medical examination without abnormal findings: Secondary | ICD-10-CM

## 2013-12-24 LAB — URINALYSIS, ROUTINE W REFLEX MICROSCOPIC
BILIRUBIN URINE: NEGATIVE
Hgb urine dipstick: NEGATIVE
KETONES UR: NEGATIVE
Leukocytes, UA: NEGATIVE
Nitrite: NEGATIVE
PH: 5.5 (ref 5.0–8.0)
Specific Gravity, Urine: 1.015 (ref 1.000–1.030)
Total Protein, Urine: NEGATIVE
UROBILINOGEN UA: 0.2 (ref 0.0–1.0)
Urine Glucose: NEGATIVE

## 2013-12-24 LAB — BASIC METABOLIC PANEL
BUN: 16 mg/dL (ref 6–23)
CO2: 27 meq/L (ref 19–32)
Calcium: 9.4 mg/dL (ref 8.4–10.5)
Chloride: 105 mEq/L (ref 96–112)
Creatinine, Ser: 0.9 mg/dL (ref 0.4–1.2)
GFR: 73 mL/min (ref >60.00)
GLUCOSE: 107 mg/dL — AB (ref 70–99)
POTASSIUM: 4.8 meq/L (ref 3.5–5.1)
SODIUM: 139 meq/L (ref 135–145)

## 2013-12-24 LAB — CBC WITH DIFFERENTIAL/PLATELET
Basophils Absolute: 0 10*3/uL (ref 0.0–0.1)
Basophils Relative: 0.7 % (ref 0.0–3.0)
Eosinophils Absolute: 0.2 10*3/uL (ref 0.0–0.7)
Eosinophils Relative: 3.1 % (ref 0.0–5.0)
HCT: 41.9 % (ref 36.0–46.0)
HEMOGLOBIN: 14.5 g/dL (ref 12.0–15.0)
LYMPHS ABS: 2.1 10*3/uL (ref 0.7–4.0)
Lymphocytes Relative: 34.2 % (ref 12.0–46.0)
MCHC: 34.6 g/dL (ref 30.0–36.0)
MCV: 91.1 fl (ref 78.0–100.0)
MONO ABS: 0.5 10*3/uL (ref 0.1–1.0)
Monocytes Relative: 7.9 % (ref 3.0–12.0)
NEUTROS ABS: 3.3 10*3/uL (ref 1.4–7.7)
Neutrophils Relative %: 54.1 % (ref 43.0–77.0)
Platelets: 247 10*3/uL (ref 150.0–400.0)
RBC: 4.6 Mil/uL (ref 3.87–5.11)
RDW: 12.6 % (ref 11.5–14.6)
WBC: 6 10*3/uL (ref 4.5–10.5)

## 2013-12-24 LAB — LIPID PANEL
CHOLESTEROL: 275 mg/dL — AB (ref 0–200)
HDL: 76 mg/dL (ref >39.00)
TRIGLYCERIDES: 88 mg/dL (ref 0.0–149.0)
Total CHOL/HDL Ratio: 4
VLDL: 17.6 mg/dL (ref 0.0–40.0)

## 2013-12-24 LAB — HEPATIC FUNCTION PANEL
ALT: 17 U/L (ref 0–35)
AST: 21 U/L (ref 0–37)
Albumin: 4.5 g/dL (ref 3.5–5.2)
Alkaline Phosphatase: 59 U/L (ref 39–117)
BILIRUBIN TOTAL: 1.2 mg/dL (ref 0.3–1.2)
Bilirubin, Direct: 0.1 mg/dL (ref 0.0–0.3)
Total Protein: 7.9 g/dL (ref 6.0–8.3)

## 2013-12-24 LAB — TSH: TSH: 3.25 u[IU]/mL (ref 0.35–5.50)

## 2013-12-24 LAB — LDL CHOLESTEROL, DIRECT: Direct LDL: 175.8 mg/dL

## 2013-12-24 MED ORDER — PANTOPRAZOLE SODIUM 40 MG PO TBEC: 40.0000 mg | DELAYED_RELEASE_TABLET | Freq: Every day | ORAL | Status: DC

## 2013-12-24 NOTE — Progress Notes (Signed)
Pre-visit discussion using our clinic review tool. No additional management support is needed unless otherwise documented below in the visit note.  

## 2013-12-24 NOTE — Progress Notes (Signed)
Subjective:    Patient ID: Allison Calderon, female    DOB: 02-04-1961, 53 y.o.   MRN: [id number]  HPI  patient is here today for annual physical. Patient feels well and has no complaints. Past Medical History  Diagnosis Date  . BREAST LUMP   . History of chicken pox   . GERD (gastroesophageal reflux disease)   . Allergy   . Hx: UTI (urinary tract infection)   . Personal history of colonic adenomas 03/13/2013    02/2013 - 2 diminutive adenomas   Family History  Problem Relation Age of Onset  . Arthritis Mother   . Diabetes Mother   . Hyperlipidemia Father   . Heart disease Father     MI @ 68  . Hypertension Paternal Grandfather   . Colon cancer Neg Hx   . Rectal cancer Neg Hx   . Stomach cancer Neg Hx   . Esophageal cancer Paternal Uncle    History  Substance Use Topics  . Smoking status: Former Smoker -- 30 years    Types: Cigarettes  . Smokeless tobacco: Never Used  . Alcohol Use: Yes     Comment: 6-12 oz a week    Review of Systems  Constitutional: Negative for fatigue and unexpected weight change.  Respiratory: Negative for cough, shortness of breath and wheezing.   Cardiovascular: Negative for chest pain, palpitations and leg swelling.  Gastrointestinal: Negative for nausea, abdominal pain and diarrhea.  Neurological: Negative for dizziness, weakness, light-headedness and headaches.  Psychiatric/Behavioral: Negative for dysphoric mood. The patient is not nervous/anxious.   All other systems reviewed and are negative.       Objective:   Physical Exam BP 102/68  Pulse 64  Temp(Src) 97.9 F (36.6 C) (Oral)  Ht 5\' 2"  (1.575 m)  Wt 125 lb 12.8 oz (57.063 kg)  BMI 23.00 kg/m2  SpO2 98% Wt Readings from Last 3 Encounters:  12/24/13 125 lb 12.8 oz (57.063 kg)  03/06/13 129 lb (58.514 kg)  02/18/13 129 lb 9.6 oz (58.786 kg)   Constitutional: She appears well-developed and well-nourished. No distress.  HENT: Head: Normocephalic and atraumatic. Ears: B TMs  ok, no erythema or effusion; Nose: Nose normal.  Mouth/Throat: Oropharynx is clear and moist. No oropharyngeal exudate.  Eyes: Conjunctivae and EOM are normal. Pupils are equal, round, and reactive to light. No scleral icterus.  Neck: Normal range of motion. Neck supple. No JVD present. No thyromegaly present.  Cardiovascular: Normal rate, regular rhythm and normal heart sounds.  No murmur heard. No BLE edema. Pulmonary/Chest: Effort normal and breath sounds normal. No respiratory distress. She has no wheezes.  Abdominal: Soft. Bowel sounds are normal. She exhibits no distension. There is no tenderness. no masses Musculoskeletal: Normal range of motion, no joint effusions. No gross deformities Neurological: She is alert and oriented to person, place, and time. No cranial nerve deficit. Coordination normal.  Skin: Skin is warm and dry. No rash noted. No erythema.  Psychiatric: She has a normal mood and affect. Her behavior is normal. Judgment and thought content normal.   Lab Results  Component Value Date   WBC 6.7 01/18/2013   HGB 13.6 01/18/2013   HCT 39.7 01/18/2013   PLT 230.0 01/18/2013   GLUCOSE 102* 01/18/2013   CHOL 242* 01/18/2013   TRIG 70.0 01/18/2013   HDL 80.70 01/18/2013   LDLDIRECT 135.9 01/18/2013   ALT 16 01/18/2013   AST 20 01/18/2013   NA 137 01/18/2013   K 4.5 01/18/2013  CL 106 01/18/2013   CREATININE 0.8 01/18/2013   BUN 17 01/18/2013   CO2 25 01/18/2013   TSH 2.86 01/18/2013   ECG: sinus @ 68 bpm - no ischemic changes     Assessment & Plan:  CPx/v70.0 - Patient has been counseled on age-appropriate routine health concerns for screening and prevention. These are reviewed and up-to-date. Immunizations are up-to-date or declined. Labs ordered and reviewed.

## 2013-12-24 NOTE — Patient Instructions (Addendum)
It was good to see you today.  We have reviewed your prior records including labs and tests today  Health Maintenance reviewed - all recommended immunizations and age-appropriate screenings are up-to-date.  Test(s) ordered today. Your results will be released to [place] (or called to you) after review, usually within 72hours after test completion. If any changes need to be made, you will be notified at that same time.  Medications reviewed and updated, no changes recommended at this time. Refill on medication(s) as discussed today.  Please schedule followup in 12 months for annual exam and labs, call sooner if problems.  Health Maintenance, Female A healthy lifestyle and preventative care can promote health and wellness.  Maintain regular health, dental, and eye exams.  Eat a healthy diet. Foods like vegetables, fruits, whole grains, low-fat dairy products, and lean protein foods contain the nutrients you need without too many calories. Decrease your intake of foods high in solid fats, added sugars, and salt. Get information about a proper diet from your caregiver, if necessary.  Regular physical exercise is one of the most important things you can do for your health. Most adults should get at least 150 minutes of moderate-intensity exercise (any activity that increases your heart rate and causes you to sweat) each week. In addition, most adults need muscle-strengthening exercises on 2 or more days a week.   Maintain a healthy weight. The body mass index (BMI) is a screening tool to identify possible weight problems. It provides an estimate of body fat based on height and weight. Your caregiver can help determine your BMI, and can help you achieve or maintain a healthy weight. For adults 20 years and older:  A BMI below 18.5 is considered underweight.  A BMI of 18.5 to 24.9 is normal.  A BMI of 25 to 29.9 is considered overweight.  A BMI of 30 and above is considered obese.  Maintain  normal blood lipids and cholesterol by exercising and minimizing your intake of saturated fat. Eat a balanced diet with plenty of fruits and vegetables. Blood tests for lipids and cholesterol should begin at age 49 and be repeated every 5 years. If your lipid or cholesterol levels are high, you are over 50, or you are a high risk for heart disease, you may need your cholesterol levels checked more frequently.Ongoing high lipid and cholesterol levels should be treated with medicines if diet and exercise are not effective.  If you smoke, find out from your caregiver how to quit. If you do not use tobacco, do not start.  Lung cancer screening is recommended for adults aged 48 80 years who are at high risk for developing lung cancer because of a history of smoking. Yearly low-dose computed tomography (CT) is recommended for people who have at least a 30-pack-year history of smoking and are a current smoker or have quit within the past 15 years. A pack year of smoking is smoking an average of 1 pack of cigarettes a day for 1 year (for example: 1 pack a day for 30 years or 2 packs a day for 15 years). Yearly screening should continue until the smoker has stopped smoking for at least 15 years. Yearly screening should also be stopped for people who develop a health problem that would prevent them from having lung cancer treatment.  If you are pregnant, do not drink alcohol. If you are breastfeeding, be very cautious about drinking alcohol. If you are not pregnant and choose to drink alcohol, do not exceed  1 drink per day. One drink is considered to be 12 ounces (355 mL) of beer, 5 ounces (148 mL) of wine, or 1.5 ounces (44 mL) of liquor.  Avoid use of street drugs. Do not share needles with anyone. Ask for help if you need support or instructions about stopping the use of drugs.  High blood pressure causes heart disease and increases the risk of stroke. Blood pressure should be checked at least every 1 to 2  years. Ongoing high blood pressure should be treated with medicines, if weight loss and exercise are not effective.  If you are 24 to 55 years old, ask your caregiver if you should take aspirin to prevent strokes.  Diabetes screening involves taking a blood sample to check your fasting blood sugar level. This should be done once every 3 years, after age 5, if you are within normal weight and without risk factors for diabetes. Testing should be considered at a younger age or be carried out more frequently if you are overweight and have at least 1 risk factor for diabetes.  Breast cancer screening is essential preventative care for women. You should practice "breast self-awareness." This means understanding the normal appearance and feel of your breasts and may include breast self-examination. Any changes detected, no matter how small, should be reported to a caregiver. Women in their 1s and 30s should have a clinical breast exam (CBE) by a caregiver as part of a regular health exam every 1 to 3 years. After age 68, women should have a CBE every year. Starting at age 41, women should consider having a mammogram (breast X-ray) every year. Women who have a family history of breast cancer should talk to their caregiver about genetic screening. Women at a high risk of breast cancer should talk to their caregiver about having an MRI and a mammogram every year.  Breast cancer gene (BRCA)-related cancer risk assessment is recommended for women who have family members with BRCA-related cancers. BRCA-related cancers include breast, ovarian, tubal, and peritoneal cancers. Having family members with these cancers may be associated with an increased risk for harmful changes (mutations) in the breast cancer genes BRCA1 and BRCA2. Results of the assessment will determine the need for genetic counseling and BRCA1 and BRCA2 testing.  The Pap test is a screening test for cervical cancer. Women should have a Pap test  starting at age 21. Between ages 72 and 73, Pap tests should be repeated every 2 years. Beginning at age 66, you should have a Pap test every 3 years as long as the past 3 Pap tests have been normal. If you had a hysterectomy for a problem that was not cancer or a condition that could lead to cancer, then you no longer need Pap tests. If you are between ages 52 and 57, and you have had normal Pap tests going back 10 years, you no longer need Pap tests. If you have had past treatment for cervical cancer or a condition that could lead to cancer, you need Pap tests and screening for cancer for at least 20 years after your treatment. If Pap tests have been discontinued, risk factors (such as a new sexual partner) need to be reassessed to determine if screening should be resumed. Some women have medical problems that increase the chance of getting cervical cancer. In these cases, your caregiver may recommend more frequent screening and Pap tests.  The human papillomavirus (HPV) test is an additional test that may be used for cervical cancer  screening. The HPV test looks for the virus that can cause the cell changes on the cervix. The cells collected during the Pap test can be tested for HPV. The HPV test could be used to screen women aged 4 years and older, and should be used in women of any age who have unclear Pap test results. After the age of 59, women should have HPV testing at the same frequency as a Pap test.  Colorectal cancer can be detected and often prevented. Most routine colorectal cancer screening begins at the age of 77 and continues through age 42. However, your caregiver may recommend screening at an earlier age if you have risk factors for colon cancer. On a yearly basis, your caregiver may provide home test kits to check for hidden blood in the stool. Use of a small camera at the end of a tube, to directly examine the colon (sigmoidoscopy or colonoscopy), can detect the earliest forms of  colorectal cancer. Talk to your caregiver about this at age 83, when routine screening begins. Direct examination of the colon should be repeated every 5 to 10 years through age 38, unless early forms of pre-cancerous polyps or small growths are found.  Hepatitis C blood testing is recommended for all people born from [birth year] through 1967 and any individual with known risks for hepatitis C.  Practice safe sex. Use condoms and avoid high-risk sexual practices to reduce the spread of sexually transmitted infections (STIs). Sexually active women aged 61 and younger should be checked for Chlamydia, which is a common sexually transmitted infection. Older women with new or multiple partners should also be tested for Chlamydia. Testing for other STIs is recommended if you are sexually active and at increased risk.  Osteoporosis is a disease in which the bones lose minerals and strength with aging. This can result in serious bone fractures. The risk of osteoporosis can be identified using a bone density scan. Women ages 52 and over and women at risk for fractures or osteoporosis should discuss screening with their caregivers. Ask your caregiver whether you should be taking a calcium supplement or vitamin D to reduce the rate of osteoporosis.  Menopause can be associated with physical symptoms and risks. Hormone replacement therapy is available to decrease symptoms and risks. You should talk to your caregiver about whether hormone replacement therapy is right for you.  Use sunscreen. Apply sunscreen liberally and repeatedly throughout the day. You should seek shade when your shadow is shorter than you. Protect yourself by wearing long sleeves, pants, a wide-brimmed hat, and sunglasses year round, whenever you are outdoors.  Notify your caregiver of new moles or changes in moles, especially if there is a change in shape or color. Also notify your caregiver if a mole is larger than the size of a pencil eraser.  Stay  current with your immunizations. Document Released: 06/20/2011 Document Revised: 04/01/2013 Document Reviewed: 06/20/2011 [facility] Patient Information 2014 [place]. Health Maintenance, Female A healthy lifestyle and preventative care can promote health and wellness.  Maintain regular health, dental, and eye exams.  Eat a healthy diet. Foods like vegetables, fruits, whole grains, low-fat dairy products, and lean protein foods contain the nutrients you need without too many calories. Decrease your intake of foods high in solid fats, added sugars, and salt. Get information about a proper diet from your caregiver, if necessary.  Regular physical exercise is one of the most important things you can do for your health. Most adults should get at least  150 minutes of moderate-intensity exercise (any activity that increases your heart rate and causes you to sweat) each week. In addition, most adults need muscle-strengthening exercises on 2 or more days a week.   Maintain a healthy weight. The body mass index (BMI) is a screening tool to identify possible weight problems. It provides an estimate of body fat based on height and weight. Your caregiver can help determine your BMI, and can help you achieve or maintain a healthy weight. For adults 20 years and older:  A BMI below 18.5 is considered underweight.  A BMI of 18.5 to 24.9 is normal.  A BMI of 25 to 29.9 is considered overweight.  A BMI of 30 and above is considered obese.  Maintain normal blood lipids and cholesterol by exercising and minimizing your intake of saturated fat. Eat a balanced diet with plenty of fruits and vegetables. Blood tests for lipids and cholesterol should begin at age 90 and be repeated every 5 years. If your lipid or cholesterol levels are high, you are over 50, or you are a high risk for heart disease, you may need your cholesterol levels checked more frequently.Ongoing high lipid and cholesterol levels should be  treated with medicines if diet and exercise are not effective.  If you smoke, find out from your caregiver how to quit. If you do not use tobacco, do not start.  Lung cancer screening is recommended for adults aged 61 80 years who are at high risk for developing lung cancer because of a history of smoking. Yearly low-dose computed tomography (CT) is recommended for people who have at least a 30-pack-year history of smoking and are a current smoker or have quit within the past 15 years. A pack year of smoking is smoking an average of 1 pack of cigarettes a day for 1 year (for example: 1 pack a day for 30 years or 2 packs a day for 15 years). Yearly screening should continue until the smoker has stopped smoking for at least 15 years. Yearly screening should also be stopped for people who develop a health problem that would prevent them from having lung cancer treatment.  If you are pregnant, do not drink alcohol. If you are breastfeeding, be very cautious about drinking alcohol. If you are not pregnant and choose to drink alcohol, do not exceed 1 drink per day. One drink is considered to be 12 ounces (355 mL) of beer, 5 ounces (148 mL) of wine, or 1.5 ounces (44 mL) of liquor.  Avoid use of street drugs. Do not share needles with anyone. Ask for help if you need support or instructions about stopping the use of drugs.  High blood pressure causes heart disease and increases the risk of stroke. Blood pressure should be checked at least every 1 to 2 years. Ongoing high blood pressure should be treated with medicines, if weight loss and exercise are not effective.  If you are 8 to 55 years old, ask your caregiver if you should take aspirin to prevent strokes.  Diabetes screening involves taking a blood sample to check your fasting blood sugar level. This should be done once every 3 years, after age 21, if you are within normal weight and without risk factors for diabetes. Testing should be considered at a  younger age or be carried out more frequently if you are overweight and have at least 1 risk factor for diabetes.  Breast cancer screening is essential preventative care for women. You should practice "breast self-awareness." This  means understanding the normal appearance and feel of your breasts and may include breast self-examination. Any changes detected, no matter how small, should be reported to a caregiver. Women in their 16s and 30s should have a clinical breast exam (CBE) by a caregiver as part of a regular health exam every 1 to 3 years. After age 24, women should have a CBE every year. Starting at age 63, women should consider having a mammogram (breast X-ray) every year. Women who have a family history of breast cancer should talk to their caregiver about genetic screening. Women at a high risk of breast cancer should talk to their caregiver about having an MRI and a mammogram every year.  Breast cancer gene (BRCA)-related cancer risk assessment is recommended for women who have family members with BRCA-related cancers. BRCA-related cancers include breast, ovarian, tubal, and peritoneal cancers. Having family members with these cancers may be associated with an increased risk for harmful changes (mutations) in the breast cancer genes BRCA1 and BRCA2. Results of the assessment will determine the need for genetic counseling and BRCA1 and BRCA2 testing.  The Pap test is a screening test for cervical cancer. Women should have a Pap test starting at age 2. Between ages 67 and 24, Pap tests should be repeated every 2 years. Beginning at age 11, you should have a Pap test every 3 years as long as the past 3 Pap tests have been normal. If you had a hysterectomy for a problem that was not cancer or a condition that could lead to cancer, then you no longer need Pap tests. If you are between ages 7 and 79, and you have had normal Pap tests going back 10 years, you no longer need Pap tests. If you have had  past treatment for cervical cancer or a condition that could lead to cancer, you need Pap tests and screening for cancer for at least 20 years after your treatment. If Pap tests have been discontinued, risk factors (such as a new sexual partner) need to be reassessed to determine if screening should be resumed. Some women have medical problems that increase the chance of getting cervical cancer. In these cases, your caregiver may recommend more frequent screening and Pap tests.  The human papillomavirus (HPV) test is an additional test that may be used for cervical cancer screening. The HPV test looks for the virus that can cause the cell changes on the cervix. The cells collected during the Pap test can be tested for HPV. The HPV test could be used to screen women aged 34 years and older, and should be used in women of any age who have unclear Pap test results. After the age of 39, women should have HPV testing at the same frequency as a Pap test.  Colorectal cancer can be detected and often prevented. Most routine colorectal cancer screening begins at the age of 63 and continues through age 62. However, your caregiver may recommend screening at an earlier age if you have risk factors for colon cancer. On a yearly basis, your caregiver may provide home test kits to check for hidden blood in the stool. Use of a small camera at the end of a tube, to directly examine the colon (sigmoidoscopy or colonoscopy), can detect the earliest forms of colorectal cancer. Talk to your caregiver about this at age 56, when routine screening begins. Direct examination of the colon should be repeated every 5 to 10 years through age 29, unless early forms of pre-cancerous polyps  or small growths are found.  Hepatitis C blood testing is recommended for all people born from [birth year] through 1967 and any individual with known risks for hepatitis C.  Practice safe sex. Use condoms and avoid high-risk sexual practices to reduce the  spread of sexually transmitted infections (STIs). Sexually active women aged 22 and younger should be checked for Chlamydia, which is a common sexually transmitted infection. Older women with new or multiple partners should also be tested for Chlamydia. Testing for other STIs is recommended if you are sexually active and at increased risk.  Osteoporosis is a disease in which the bones lose minerals and strength with aging. This can result in serious bone fractures. The risk of osteoporosis can be identified using a bone density scan. Women ages 37 and over and women at risk for fractures or osteoporosis should discuss screening with their caregivers. Ask your caregiver whether you should be taking a calcium supplement or vitamin D to reduce the rate of osteoporosis.  Menopause can be associated with physical symptoms and risks. Hormone replacement therapy is available to decrease symptoms and risks. You should talk to your caregiver about whether hormone replacement therapy is right for you.  Use sunscreen. Apply sunscreen liberally and repeatedly throughout the day. You should seek shade when your shadow is shorter than you. Protect yourself by wearing long sleeves, pants, a wide-brimmed hat, and sunglasses year round, whenever you are outdoors.  Notify your caregiver of new moles or changes in moles, especially if there is a change in shape or color. Also notify your caregiver if a mole is larger than the size of a pencil eraser.  Stay current with your immunizations. Document Released: 06/20/2011 Document Revised: 04/01/2013 Document Reviewed: 06/20/2011 [facility] Patient Information 2014 [place].

## 2014-02-06 ENCOUNTER — Other Ambulatory Visit: Payer: Self-pay

## 2014-02-06 DIAGNOSIS — Z1231 Encounter for screening mammogram for malignant neoplasm of breast: Secondary | ICD-10-CM

## 2014-02-24 ENCOUNTER — Other Ambulatory Visit: Payer: Self-pay | Admitting: *Deleted

## 2014-02-24 ENCOUNTER — Encounter: Payer: Self-pay | Admitting: Internal Medicine

## 2014-02-24 MED ORDER — PANTOPRAZOLE SODIUM 40 MG PO TBEC: 40.0000 mg | DELAYED_RELEASE_TABLET | Freq: Every day | ORAL | Status: DC

## 2014-02-24 NOTE — Telephone Encounter (Signed)
Sent email needing refill on her protonix...Johny Chess

## 2014-02-25 ENCOUNTER — Ambulatory Visit: Payer: 59

## 2014-03-03 ENCOUNTER — Ambulatory Visit
Admission: RE | Admit: 2014-03-03 | Discharge: 2014-03-03 | Disposition: A | Discharge status: Home / Self Care | Payer: 59 | Source: Ambulatory Visit

## 2014-03-03 DIAGNOSIS — Z1231 Encounter for screening mammogram for malignant neoplasm of breast: Secondary | ICD-10-CM

## 2014-03-20 ENCOUNTER — Encounter: Payer: Self-pay | Admitting: Internal Medicine

## 2014-03-20 DIAGNOSIS — M8580 Other specified disorders of bone density and structure, unspecified site: Secondary | ICD-10-CM

## 2014-03-20 NOTE — Telephone Encounter (Signed)
Pt sent email wanting to know when was her last bone density. Last one was done 2012. Pt is wanting order to be out in and she will have it done at the [facility]...Johny Chess

## 2014-06-13 ENCOUNTER — Ambulatory Visit
Admission: RE | Admit: 2014-06-13 | Discharge: 2014-06-13 | Disposition: A | Discharge status: Home / Self Care | Payer: 59 | Source: Ambulatory Visit | Attending: Internal Medicine | Admitting: Internal Medicine

## 2014-06-13 DIAGNOSIS — M8580 Other specified disorders of bone density and structure, unspecified site: Secondary | ICD-10-CM

## 2014-10-06 ENCOUNTER — Other Ambulatory Visit: Payer: Self-pay | Admitting: Family Medicine

## 2014-10-06 ENCOUNTER — Encounter: Payer: Self-pay | Admitting: Internal Medicine

## 2014-10-06 ENCOUNTER — Other Ambulatory Visit: Payer: Self-pay | Admitting: Internal Medicine

## 2014-10-06 DIAGNOSIS — R300 Dysuria: Secondary | ICD-10-CM

## 2014-10-07 ENCOUNTER — Encounter: Payer: Self-pay | Admitting: Internal Medicine

## 2014-10-07 DIAGNOSIS — R300 Dysuria: Secondary | ICD-10-CM

## 2014-10-07 LAB — POCT URINALYSIS DIPSTICK
Bilirubin, UA: NEGATIVE
Glucose, UA: NEGATIVE
Ketones, UA: NEGATIVE
NITRITE UA: NEGATIVE
PROTEIN UA: NEGATIVE
SPEC GRAV UA: 1.01
UROBILINOGEN UA: 0.2
pH, UA: 6

## 2014-10-07 LAB — POCT UA - MICROSCOPIC ONLY

## 2014-10-07 NOTE — Progress Notes (Signed)
Ran routine urine and microscopic,  Also sent urine for culture

## 2014-10-08 LAB — URINE CULTURE: Colony Count: >=100000

## 2015-01-08 ENCOUNTER — Encounter: Payer: Self-pay | Admitting: Internal Medicine

## 2015-01-08 ENCOUNTER — Other Ambulatory Visit (INDEPENDENT_AMBULATORY_CARE_PROVIDER_SITE_OTHER): Payer: 59

## 2015-01-08 ENCOUNTER — Ambulatory Visit (INDEPENDENT_AMBULATORY_CARE_PROVIDER_SITE_OTHER): Payer: 59 | Admitting: Internal Medicine

## 2015-01-08 VITALS — BP 118/80 | HR 69 | Temp 98.3°F | Ht 62.0 in | Wt 128.2 lb

## 2015-01-08 DIAGNOSIS — Z0000 Encounter for general adult medical examination without abnormal findings: Secondary | ICD-10-CM

## 2015-01-08 LAB — URINALYSIS, ROUTINE W REFLEX MICROSCOPIC
Bilirubin Urine: NEGATIVE
Hgb urine dipstick: NEGATIVE
Ketones, ur: NEGATIVE
Leukocytes, UA: NEGATIVE
Nitrite: NEGATIVE
RBC / HPF: NONE SEEN (ref 0–?)
TOTAL PROTEIN, URINE-UPE24: NEGATIVE
URINE GLUCOSE: NEGATIVE
Urobilinogen, UA: 0.2 (ref 0.0–1.0)
pH: 7 (ref 5.0–8.0)

## 2015-01-08 LAB — HEPATIC FUNCTION PANEL
ALK PHOS: 75 U/L (ref 39–117)
ALT: 26 U/L (ref 0–35)
AST: 28 U/L (ref 0–37)
Albumin: 4.5 g/dL (ref 3.5–5.2)
BILIRUBIN DIRECT: 0.2 mg/dL (ref 0.0–0.3)
BILIRUBIN TOTAL: 0.9 mg/dL (ref 0.2–1.2)
Total Protein: 7.7 g/dL (ref 6.0–8.3)

## 2015-01-08 LAB — CBC WITH DIFFERENTIAL/PLATELET
Basophils Absolute: 0 10*3/uL (ref 0.0–0.1)
Basophils Relative: 0.9 % (ref 0.0–3.0)
Eosinophils Absolute: 0.2 10*3/uL (ref 0.0–0.7)
Eosinophils Relative: 3.1 % (ref 0.0–5.0)
HCT: 39.4 % (ref 36.0–46.0)
Hemoglobin: 13.4 g/dL (ref 12.0–15.0)
Lymphocytes Relative: 37.6 % (ref 12.0–46.0)
Lymphs Abs: 2.1 10*3/uL (ref 0.7–4.0)
MCHC: 34 g/dL (ref 30.0–36.0)
MCV: 89 fl (ref 78.0–100.0)
MONO ABS: 0.5 10*3/uL (ref 0.1–1.0)
Monocytes Relative: 9 % (ref 3.0–12.0)
NEUTROS ABS: 2.8 10*3/uL (ref 1.4–7.7)
NEUTROS PCT: 49.4 % (ref 43.0–77.0)
Platelets: 228 10*3/uL (ref 150.0–400.0)
RBC: 4.42 Mil/uL (ref 3.87–5.11)
RDW: 13.1 % (ref 11.5–15.5)
WBC: 5.6 10*3/uL (ref 4.0–10.5)

## 2015-01-08 LAB — LIPID PANEL
Cholesterol: 274 mg/dL — ABNORMAL HIGH (ref 0–200)
HDL: 85.7 mg/dL (ref >39.00)
LDL CALC: 168 mg/dL — AB (ref 0–99)
NONHDL: 188.3
Total CHOL/HDL Ratio: 3
Triglycerides: 104 mg/dL (ref 0.0–149.0)
VLDL: 20.8 mg/dL (ref 0.0–40.0)

## 2015-01-08 LAB — BASIC METABOLIC PANEL
BUN: 12 mg/dL (ref 6–23)
CALCIUM: 9.8 mg/dL (ref 8.4–10.5)
CO2: 27 mEq/L (ref 19–32)
Chloride: 105 mEq/L (ref 96–112)
Creatinine, Ser: 0.98 mg/dL (ref 0.40–1.20)
GFR: 62.54 mL/min (ref >60.00)
Glucose, Bld: 114 mg/dL — ABNORMAL HIGH (ref 70–99)
POTASSIUM: 4.3 meq/L (ref 3.5–5.1)
SODIUM: 140 meq/L (ref 135–145)

## 2015-01-08 LAB — TSH: TSH: 3.51 u[IU]/mL (ref 0.35–4.50)

## 2015-01-08 NOTE — Progress Notes (Signed)
Subjective:    Patient ID: Allison Calderon, female    DOB: 11-05-1961, 54 y.o.   MRN: [id number]  HPI  patient is here today for annual physical. Patient feels well and has no complaints.  Also reviewed chronic medical issues and interval medical events  Past Medical History  Diagnosis Date  . BREAST LUMP   . History of chicken pox   . GERD (gastroesophageal reflux disease)   . Allergy   . Hx: UTI (urinary tract infection)   . Personal history of colonic adenomas 03/13/2013    02/2013 - 2 diminutive adenomas  . Osteoarthritis, knee     R>L   Family History  Problem Relation Age of Onset  . Arthritis Mother   . Diabetes Mother   . Hyperlipidemia Father   . Heart disease Father     MI @ 1  . Hypertension Paternal Grandfather   . Colon cancer Neg Hx   . Rectal cancer Neg Hx   . Stomach cancer Neg Hx   . Esophageal cancer Paternal Uncle   . Glaucoma Mother     R eye stent 2015   History  Substance Use Topics  . Smoking status: Former Smoker -- 30 years    Types: Cigarettes  . Smokeless tobacco: Former Systems developer    Quit date: 12/19/1978  . Alcohol Use: 0.0 oz/week    0 Not specified per week     Comment: 6-12 oz a week    Review of Systems  Constitutional: Negative for fatigue and unexpected weight change.  Respiratory: Negative for cough, shortness of breath and wheezing.   Cardiovascular: Negative for chest pain, palpitations and leg swelling.  Gastrointestinal: Negative for nausea, abdominal pain (hx 3 days LLQ pain/ache associated with chills late 11/2014 - resolved spontaneously with rest) and diarrhea.  Genitourinary: Negative for urgency, decreased urine volume, vaginal bleeding, menstrual problem and pelvic pain.  Musculoskeletal: Positive for arthralgias (intermittent B knee pain, occ L swelling after exertion). Negative for myalgias.  Neurological: Negative for dizziness, weakness, light-headedness and headaches.  Psychiatric/Behavioral: Negative for  dysphoric mood. The patient is not nervous/anxious.   All other systems reviewed and are negative.      Objective:   Physical Exam  BP 118/80 mmHg  Pulse 69  Temp(Src) 98.3 F (36.8 C) (Oral)  Ht 5\' 2"  (1.575 m)  Wt 128 lb 4 oz (58.174 kg)  BMI 23.45 kg/m2  SpO2 97% Wt Readings from Last 3 Encounters:  01/08/15 128 lb 4 oz (58.174 kg)  12/24/13 125 lb 12.8 oz (57.063 kg)  03/06/13 129 lb (58.514 kg)   Constitutional: She appears well-developed and well-nourished. No distress.  HENT: Head: Normocephalic and atraumatic. Ears: B TMs ok, no erythema or effusion; Nose: Nose normal. Mouth/Throat: Oropharynx is clear and moist. No oropharyngeal exudate.  Eyes: Conjunctivae and EOM are normal. Pupils are equal, round, and reactive to light. No scleral icterus.  Neck: Normal range of motion. Neck supple. No JVD present. No thyromegaly present.  Cardiovascular: Normal rate, regular rhythm and normal heart sounds.  No murmur heard. No BLE edema. Pulmonary/Chest: Effort normal and breath sounds normal. No respiratory distress. She has no wheezes.  Abdominal: Soft. Bowel sounds are normal. She exhibits no distension. There is no tenderness. no masses GU/breast: defer to gyn Musculoskeletal: Normal range of motion, no joint effusions. No gross deformities Neurological: She is alert and oriented to person, place, and time. No cranial nerve deficit. Coordination, balance, strength, speech and gait are normal.  Skin: Skin is warm and dry. No rash noted. No erythema.  Psychiatric: She has a normal mood and affect. Her behavior is normal. Judgment and thought content normal.    Lab Results  Component Value Date   WBC 6.0 12/24/2013   HGB 14.5 12/24/2013   HCT 41.9 12/24/2013   PLT 247.0 12/24/2013   GLUCOSE 107* 12/24/2013   CHOL 275* 12/24/2013   TRIG 88.0 12/24/2013   HDL 76.00 12/24/2013   LDLDIRECT 175.8 12/24/2013   ALT 17 12/24/2013   AST 21 12/24/2013   NA 139 12/24/2013   K 4.8  12/24/2013   CL 105 12/24/2013   CREATININE 0.9 12/24/2013   BUN 16 12/24/2013   CO2 27 12/24/2013   TSH 3.25 12/24/2013    Dg Bone Density  06/13/2014   CLINICAL DATA:  Premenopausal. Calcium and vitamin-D supplements. The patient took Fosamax 6 years ago.  EXAM: DUAL X-RAY ABSORPTIOMETRY (DXA) FOR BONE MINERAL DENSITY  FINDINGS: AP LUMBAR SPINE L1-L4  Bone Mineral Density (BMD):  0.860 g/cm2  Z-Score:  -0.6  Left FEMUR neck  Bone Mineral Density (BMD):  0.828 g/cm2  Z-Score:  0.9  ASSESSMENT: The patient's Z-score is within expected range for age  FRACTURE RISK: Not increased  FRAX: World Health Organization FRAX assessment of absolute fracture risk is not calculated for this patient because of premenopausal status.  COMPARISON: 12/28/2010:  Lumbar spine:  No significant change since the prior study.  Total hip:  No significant change since prior study.  All patients should ensure an adequate intake of dietary calcium (1200 mg daily) and vitamin D (800 IU daily) unless contraindicated, and avoid modifiable risk factors. If the Z-score is BELOW expected range for age, this is consistent with low bone mass, and medical evaluation for secondary causes of low bone mass maybe appropriate.  People with diagnosed cases of osteoporosis or at high risk for fracture should have regular bone mineral density tests. For patients eligible for Medicare, routine testing is allowed once every 2 years. The testing frequency can be increased to one year for patients who have rapidly progressing disease, those who are receiving or discontinuing medical therapy to restore bone mass, or have additional risk factors.  Comparison to Reference Population:  T-score is the key measure used in the diagnosis of osteoporosis and relative risk determination for fracture. It provides a value for bone mass relative to the mean bone mass of a young adult reference population expressed in terms of standard deviation (SD).  Z-score is the  age-matched score showing the patient's values compared to a population matched for age, sex, and race. This is also expressed in terms of standard deviation. The patient may have values that compare favorably to the age-matched values and still be at increased risk for fracture.  AP LUMBAR SPINE L1-L4  Bone Mineral Density (BMD):  0.860 g/cm2   Electronically Signed   By: Shon Hale M.D.   On: 06/13/2014 17:15       Assessment & Plan:   CPX/z00.00 - Patient has been counseled on age-appropriate routine health concerns for screening and prevention. These are reviewed and up-to-date. Immunizations are up-to-date or declined. Labs and reviewed.

## 2015-01-08 NOTE — Progress Notes (Signed)
Pre visit review using our clinic review tool, if applicable. No additional management support is needed unless otherwise documented below in the visit note. 

## 2015-01-08 NOTE — Patient Instructions (Addendum)
It was good to see you today.  We have reviewed your prior records including labs and tests today  Health Maintenance reviewed - all recommended immunizations and age-appropriate screenings are up-to-date.  Test(s) ordered today. Your results will be released to [place] (or called to you) after review, usually within 72hours after test completion. If any changes need to be made, you will be notified at that same time.  Medications reviewed and updated, no changes recommended at this time.  Please schedule followup in 12 months for annual exam and labs, call sooner if problems.  Health Maintenance Adopting a healthy lifestyle and getting preventive care can go a long way to promote health and wellness. Talk with your health care provider about what schedule of regular examinations is right for you. This is a good chance for you to check in with your provider about disease prevention and staying healthy. In between checkups, there are plenty of things you can do on your own. Experts have done a lot of research about which lifestyle changes and preventive measures are most likely to keep you healthy. Ask your health care provider for more information. WEIGHT AND DIET  Eat a healthy diet  Be sure to include plenty of vegetables, fruits, low-fat dairy products, and lean protein.  Do not eat a lot of foods high in solid fats, added sugars, or salt.  Get regular exercise. This is one of the most important things you can do for your health.  Most adults should exercise for at least 150 minutes each week. The exercise should increase your heart rate and make you sweat (moderate-intensity exercise).  Most adults should also do strengthening exercises at least twice a week. This is in addition to the moderate-intensity exercise.  Maintain a healthy weight  Body mass index (BMI) is a measurement that can be used to identify possible weight problems. It estimates body fat based on height and weight.  Your health care provider can help determine your BMI and help you achieve or maintain a healthy weight.  For females 20 years of age and older:   A BMI below 18.5 is considered underweight.  A BMI of 18.5 to 24.9 is normal.  A BMI of 25 to 29.9 is considered overweight.  A BMI of 30 and above is considered obese.  Watch levels of cholesterol and blood lipids  You should start having your blood tested for lipids and cholesterol at 56 years of age, then have this test every 5 years.  You may need to have your cholesterol levels checked more often if:  Your lipid or cholesterol levels are high.  You are older than 56 years of age.  You are at high risk for heart disease.  CANCER SCREENING   Lung Cancer  Lung cancer screening is recommended for adults 1-59 years old who are at high risk for lung cancer because of a history of smoking.  A yearly low-dose CT scan of the lungs is recommended for people who:  Currently smoke.  Have quit within the past 15 years.  Have at least a 30-pack-year history of smoking. A pack year is smoking an average of one pack of cigarettes a day for 1 year.  Yearly screening should continue until it has been 15 years since you quit.  Yearly screening should stop if you develop a health problem that would prevent you from having lung cancer treatment.  Breast Cancer  Practice breast self-awareness. This means understanding how your breasts normally appear and  feel.  It also means doing regular breast self-exams. Let your health care provider know about any changes, no matter how small.  If you are in your 20s or 30s, you should have a clinical breast exam (CBE) by a health care provider every 1-3 years as part of a regular health exam.  If you are 40 or older, have a CBE every year. Also consider having a breast X-ray (mammogram) every year.  If you have a family history of breast cancer, talk to your health care provider about genetic  screening.  If you are at high risk for breast cancer, talk to your health care provider about having an MRI and a mammogram every year.  Breast cancer gene (BRCA) assessment is recommended for women who have family members with BRCA-related cancers. BRCA-related cancers include:  Breast.  Ovarian.  Tubal.  Peritoneal cancers.  Results of the assessment will determine the need for genetic counseling and BRCA1 and BRCA2 testing. Cervical Cancer Routine pelvic examinations to screen for cervical cancer are no longer recommended for nonpregnant women who are considered low risk for cancer of the pelvic organs (ovaries, uterus, and vagina) and who do not have symptoms. A pelvic examination may be necessary if you have symptoms including those associated with pelvic infections. Ask your health care provider if a screening pelvic exam is right for you.   The Pap test is the screening test for cervical cancer for women who are considered at risk.  If you had a hysterectomy for a problem that was not cancer or a condition that could lead to cancer, then you no longer need Pap tests.  If you are older than 65 years, and you have had normal Pap tests for the past 10 years, you no longer need to have Pap tests.  If you have had past treatment for cervical cancer or a condition that could lead to cancer, you need Pap tests and screening for cancer for at least 20 years after your treatment.  If you no longer get a Pap test, assess your risk factors if they change (such as having a new sexual partner). This can affect whether you should start being screened again.  Some women have medical problems that increase their chance of getting cervical cancer. If this is the case for you, your health care provider may recommend more frequent screening and Pap tests.  The human papillomavirus (HPV) test is another test that may be used for cervical cancer screening. The HPV test looks for the virus that can  cause cell changes in the cervix. The cells collected during the Pap test can be tested for HPV.  The HPV test can be used to screen women 30 years of age and older. Getting tested for HPV can extend the interval between normal Pap tests from three to five years.  An HPV test also should be used to screen women of any age who have unclear Pap test results.  After 56 years of age, women should have HPV testing as often as Pap tests.  Colorectal Cancer  This type of cancer can be detected and often prevented.  Routine colorectal cancer screening usually begins at 56 years of age and continues through 56 years of age.  Your health care provider may recommend screening at an earlier age if you have risk factors for colon cancer.  Your health care provider may also recommend using home test kits to check for hidden blood in the stool.  A small camera   at the end of a tube can be used to examine your colon directly (sigmoidoscopy or colonoscopy). This is done to check for the earliest forms of colorectal cancer.  Routine screening usually begins at age 50.  Direct examination of the colon should be repeated every 5-10 years through 56 years of age. However, you may need to be screened more often if early forms of precancerous polyps or small growths are found. Skin Cancer  Check your skin from head to toe regularly.  Tell your health care provider about any new moles or changes in moles, especially if there is a change in a mole's shape or color.  Also tell your health care provider if you have a mole that is larger than the size of a pencil eraser.  Always use sunscreen. Apply sunscreen liberally and repeatedly throughout the day.  Protect yourself by wearing long sleeves, pants, a wide-brimmed hat, and sunglasses whenever you are outside. HEART DISEASE, DIABETES, AND HIGH BLOOD PRESSURE   Have your blood pressure checked at least every 1-2 years. High blood pressure causes heart  disease and increases the risk of stroke.  If you are between 55 years and 79 years old, ask your health care provider if you should take aspirin to prevent strokes.  Have regular diabetes screenings. This involves taking a blood sample to check your fasting blood sugar level.  If you are at a normal weight and have a low risk for diabetes, have this test once every three years after 56 years of age.  If you are overweight and have a high risk for diabetes, consider being tested at a younger age or more often. PREVENTING INFECTION  Hepatitis B  If you have a higher risk for hepatitis B, you should be screened for this virus. You are considered at high risk for hepatitis B if:  You were born in a country where hepatitis B is common. Ask your health care provider which countries are considered high risk.  Your parents were born in a high-risk country, and you have not been immunized against hepatitis B (hepatitis B vaccine).  You have HIV or AIDS.  You use needles to inject street drugs.  You live with someone who has hepatitis B.  You have had sex with someone who has hepatitis B.  You get hemodialysis treatment.  You take certain medicines for conditions, including cancer, organ transplantation, and autoimmune conditions. Hepatitis C  Blood testing is recommended for:  Everyone born from 1945 through 1965.  Anyone with known risk factors for hepatitis C. Sexually transmitted infections (STIs)  You should be screened for sexually transmitted infections (STIs) including gonorrhea and chlamydia if:  You are sexually active and are younger than 56 years of age.  You are older than 56 years of age and your health care provider tells you that you are at risk for this type of infection.  Your sexual activity has changed since you were last screened and you are at an increased risk for chlamydia or gonorrhea. Ask your health care provider if you are at risk.  If you do not have  HIV, but are at risk, it may be recommended that you take a prescription medicine daily to prevent HIV infection. This is called pre-exposure prophylaxis (PrEP). You are considered at risk if:  You are sexually active and do not regularly use condoms or know the HIV status of your partner(s).  You take drugs by injection.  You are sexually active with a partner   who has HIV. Talk with your health care provider about whether you are at high risk of being infected with HIV. If you choose to begin PrEP, you should first be tested for HIV. You should then be tested every 3 months for as long as you are taking PrEP.  PREGNANCY   If you are premenopausal and you may become pregnant, ask your health care provider about preconception counseling.  If you may become pregnant, take 400 to 800 micrograms (mcg) of folic acid every day.  If you want to prevent pregnancy, talk to your health care provider about birth control (contraception). OSTEOPOROSIS AND MENOPAUSE   Osteoporosis is a disease in which the bones lose minerals and strength with aging. This can result in serious bone fractures. Your risk for osteoporosis can be identified using a bone density scan.  If you are 65 years of age or older, or if you are at risk for osteoporosis and fractures, ask your health care provider if you should be screened.  Ask your health care provider whether you should take a calcium or vitamin D supplement to lower your risk for osteoporosis.  Menopause may have certain physical symptoms and risks.  Hormone replacement therapy may reduce some of these symptoms and risks. Talk to your health care provider about whether hormone replacement therapy is right for you.  HOME CARE INSTRUCTIONS   Schedule regular health, dental, and eye exams.  Stay current with your immunizations.   Do not use any tobacco products including cigarettes, chewing tobacco, or electronic cigarettes.  If you are pregnant, do not  drink alcohol.  If you are breastfeeding, limit how much and how often you drink alcohol.  Limit alcohol intake to no more than 1 drink per day for nonpregnant women. One drink equals 12 ounces of beer, 5 ounces of wine, or 1 ounces of hard liquor.  Do not use street drugs.  Do not share needles.  Ask your health care provider for help if you need support or information about quitting drugs.  Tell your health care provider if you often feel depressed.  Tell your health care provider if you have ever been abused or do not feel safe at home. Document Released: 06/20/2011 Document Revised: 04/21/2014 Document Reviewed: 11/06/2013 Exit[employer] Patient Information 2015 [employer]. This information is not intended to replace advice given to you by your health care provider. Make sure you discuss any questions you have with your health care provider.  

## 2015-01-09 ENCOUNTER — Encounter: Payer: Self-pay | Admitting: Internal Medicine

## 2015-01-09 DIAGNOSIS — R739 Hyperglycemia, unspecified: Secondary | ICD-10-CM

## 2015-01-09 HISTORY — DX: Hyperglycemia, unspecified: R73.9

## 2015-01-12 NOTE — Telephone Encounter (Signed)
Spoke to pt, pt will come back in for a1c draw.

## 2015-01-13 ENCOUNTER — Other Ambulatory Visit (INDEPENDENT_AMBULATORY_CARE_PROVIDER_SITE_OTHER): Payer: 59

## 2015-01-13 DIAGNOSIS — R739 Hyperglycemia, unspecified: Secondary | ICD-10-CM

## 2015-01-13 LAB — HEMOGLOBIN A1C: HEMOGLOBIN A1C: 5.7 % (ref 4.6–6.5)

## 2015-02-09 ENCOUNTER — Encounter: Payer: Self-pay | Admitting: Internal Medicine

## 2015-02-09 ENCOUNTER — Other Ambulatory Visit: Payer: Self-pay

## 2015-02-09 DIAGNOSIS — Z1231 Encounter for screening mammogram for malignant neoplasm of breast: Secondary | ICD-10-CM

## 2015-02-09 MED ORDER — PANTOPRAZOLE SODIUM 40 MG PO TBEC: 40.0000 mg | DELAYED_RELEASE_TABLET | Freq: Every day | ORAL | Status: DC

## 2015-03-05 ENCOUNTER — Ambulatory Visit
Admission: RE | Admit: 2015-03-05 | Discharge: 2015-03-05 | Disposition: A | Discharge status: Home / Self Care | Payer: 59 | Source: Ambulatory Visit

## 2015-03-05 DIAGNOSIS — Z1231 Encounter for screening mammogram for malignant neoplasm of breast: Secondary | ICD-10-CM

## 2016-01-06 DIAGNOSIS — L820 Inflamed seborrheic keratosis: Secondary | ICD-10-CM | POA: Diagnosis not present

## 2016-01-06 DIAGNOSIS — D225 Melanocytic nevi of trunk: Secondary | ICD-10-CM | POA: Diagnosis not present

## 2016-01-06 DIAGNOSIS — L814 Other melanin hyperpigmentation: Secondary | ICD-10-CM | POA: Diagnosis not present

## 2016-01-06 DIAGNOSIS — L821 Other seborrheic keratosis: Secondary | ICD-10-CM | POA: Diagnosis not present

## 2016-01-06 DIAGNOSIS — D1801 Hemangioma of skin and subcutaneous tissue: Secondary | ICD-10-CM | POA: Diagnosis not present

## 2016-01-06 DIAGNOSIS — L918 Other hypertrophic disorders of the skin: Secondary | ICD-10-CM | POA: Diagnosis not present

## 2016-01-19 ENCOUNTER — Other Ambulatory Visit: Payer: Self-pay

## 2016-01-19 DIAGNOSIS — Z1231 Encounter for screening mammogram for malignant neoplasm of breast: Secondary | ICD-10-CM

## 2016-01-21 ENCOUNTER — Ambulatory Visit (INDEPENDENT_AMBULATORY_CARE_PROVIDER_SITE_OTHER): Payer: 59 | Admitting: Family Medicine

## 2016-01-21 ENCOUNTER — Encounter: Payer: Self-pay | Admitting: Family Medicine

## 2016-01-21 VITALS — BP 128/70 | HR 64 | Ht 62.0 in | Wt 136.0 lb

## 2016-01-21 DIAGNOSIS — Z7189 Other specified counseling: Secondary | ICD-10-CM | POA: Diagnosis not present

## 2016-01-21 DIAGNOSIS — Z8739 Personal history of other diseases of the musculoskeletal system and connective tissue: Secondary | ICD-10-CM

## 2016-01-21 DIAGNOSIS — Z7689 Persons encountering health services in other specified circumstances: Secondary | ICD-10-CM

## 2016-01-21 DIAGNOSIS — Z8639 Personal history of other endocrine, nutritional and metabolic disease: Secondary | ICD-10-CM

## 2016-01-21 NOTE — Progress Notes (Signed)
   Subjective:    Patient ID: Allison Calderon, female    DOB: 1961-10-12, 55 y.o.   MRN: [id number]  HPI Chief Complaint  Patient presents with  . new pt    new pt, get est. no other concerns   She is new to the practice and here to establish primary care. 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  No complaints or concerns today.  She has history of elevated fasting glucose, last one 12/2014. She also had a hemoglobin A1C in 12/2014 and it was 5.7%. She is aware this places her in the prediabetes category. She exercises regularly and eats an overall healthy diet but states she does enjoy carbohydrates. She does not eat a lot of sweets.  She has knee pain, sub patella, but does jog sometimes. She takes glucosamine- chondrointin for this.   Other providers: Gustavo Lah NP [facility]- annual exams, appt in 2 weeks.  [facility]- annual skin checks Marica Otter- Optometrist- contact lenses, annual exams.   [facility]- mammograms- [facility] Carle[facility] Purl- colonscopy- polyps - last one March 2014, supposed to be every 3 years as she recalls. She will check on this.  History of osteopenia- took oral contraceptives for a while. Fosamax for 10 years Bone density- has had in past due to irregular menses. She took OCPs for a while and cycles became regular. She now takes Primrin temporarily. Has not had cycle since June 2016.  zostavax- not due Pneumonia vaccine- not due   Diet - fairly healthy but includes carbohydrates.  Exercise- cardio, weight bearing exercises.   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  Married.  Father - passed at 39 cancer of biliary tract. Parents with diabetes.    Review of Systems Pertinent positives and negatives in the history of present illness.     Objective:   Physical Exam BP 128/70 mmHg  Pulse 64  Ht 5\' 2"  (1.575 m)  Wt 136 lb  (61.689 kg)  BMI 24.87 kg/m2  Alert and in no distress. Cardiac exam shows a regular sinus rhythm without murmurs or gallops. Lungs are clear to auscultation. Not otherwise examined.       Assessment & Plan:  History of elevated glucose - Plan: CBC with Differential/Platelet, Comprehensive metabolic panel, Hemoglobin A1c  Encounter to establish care  History of osteopenia - Plan: CBC with Differential/Platelet, Comprehensive metabolic panel  History of elevated lipids - Plan: Lipid panel  Congratulated her on being on top of her health and taking good care of herself. Reviewed lab results with patient. Her hemoglobin A1c was 5.7 last January 2016. Discussed continuing with lifestyle modifications and that we need to keep an eye on this. She has family history of diabetes type 2. She is up to date on immunizations and health maintenance. She will continue with weight bearing exercises and vitamin with calcium and vitamin D for osteopenia. She will return for fasting blood work, orders placed for future.

## 2016-01-27 ENCOUNTER — Other Ambulatory Visit: Payer: 59

## 2016-01-27 DIAGNOSIS — Z8739 Personal history of other diseases of the musculoskeletal system and connective tissue: Secondary | ICD-10-CM

## 2016-01-27 DIAGNOSIS — Z8639 Personal history of other endocrine, nutritional and metabolic disease: Secondary | ICD-10-CM

## 2016-01-27 LAB — CBC WITH DIFFERENTIAL/PLATELET
BASOS PCT: 1 % (ref 0–1)
Basophils Absolute: 0.1 10*3/uL (ref 0.0–0.1)
EOS ABS: 0.2 10*3/uL (ref 0.0–0.7)
Eosinophils Relative: 4 % (ref 0–5)
HEMATOCRIT: 39.9 % (ref 36.0–46.0)
Hemoglobin: 13.3 g/dL (ref 12.0–15.0)
Lymphocytes Relative: 37 % (ref 12–46)
Lymphs Abs: 1.9 10*3/uL (ref 0.7–4.0)
MCH: 30.2 pg (ref 26.0–34.0)
MCHC: 33.3 g/dL (ref 30.0–36.0)
MCV: 90.5 fL (ref 78.0–100.0)
MONOS PCT: 9 % (ref 3–12)
MPV: 10 fL (ref 8.6–12.4)
Monocytes Absolute: 0.5 10*3/uL (ref 0.1–1.0)
NEUTROS PCT: 49 % (ref 43–77)
Neutro Abs: 2.5 10*3/uL (ref 1.7–7.7)
Platelets: 232 10*3/uL (ref 150–400)
RBC: 4.41 MIL/uL (ref 3.87–5.11)
RDW: 13.1 % (ref 11.5–15.5)
WBC: 5.1 10*3/uL (ref 4.0–10.5)

## 2016-01-27 LAB — COMPREHENSIVE METABOLIC PANEL
ALBUMIN: 4.2 g/dL (ref 3.6–5.1)
ALT: 18 U/L (ref 6–29)
AST: 20 U/L (ref 10–35)
Alkaline Phosphatase: 62 U/L (ref 33–130)
BUN: 13 mg/dL (ref 7–25)
CALCIUM: 9.6 mg/dL (ref 8.6–10.4)
CHLORIDE: 104 mmol/L (ref 98–110)
CO2: 26 mmol/L (ref 20–31)
Creat: 0.96 mg/dL (ref 0.50–1.05)
Glucose, Bld: 92 mg/dL (ref 65–99)
POTASSIUM: 4.3 mmol/L (ref 3.5–5.3)
SODIUM: 139 mmol/L (ref 135–146)
TOTAL PROTEIN: 7.2 g/dL (ref 6.1–8.1)
Total Bilirubin: 0.8 mg/dL (ref 0.2–1.2)

## 2016-01-27 LAB — LIPID PANEL
CHOLESTEROL: 278 mg/dL — AB (ref 125–200)
HDL: 86 mg/dL (ref >=46)
LDL Cholesterol: 176 mg/dL — ABNORMAL HIGH (ref <130)
TRIGLYCERIDES: 81 mg/dL (ref <150)
Total CHOL/HDL Ratio: 3.2 Ratio (ref <=5.0)
VLDL: 16 mg/dL (ref <30)

## 2016-01-28 LAB — HEMOGLOBIN A1C
HEMOGLOBIN A1C: 5.8 % — AB (ref <5.7)
Mean Plasma Glucose: 120 mg/dL — ABNORMAL HIGH (ref <117)

## 2016-02-03 DIAGNOSIS — Z124 Encounter for screening for malignant neoplasm of cervix: Secondary | ICD-10-CM | POA: Diagnosis not present

## 2016-02-03 DIAGNOSIS — Z01419 Encounter for gynecological examination (general) (routine) without abnormal findings: Secondary | ICD-10-CM | POA: Diagnosis not present

## 2016-02-03 DIAGNOSIS — N952 Postmenopausal atrophic vaginitis: Secondary | ICD-10-CM | POA: Diagnosis not present

## 2016-02-03 DIAGNOSIS — Z1151 Encounter for screening for human papillomavirus (HPV): Secondary | ICD-10-CM | POA: Diagnosis not present

## 2016-02-08 LAB — HM PAP SMEAR: HM Pap smear: NEGATIVE

## 2016-02-09 ENCOUNTER — Encounter: Payer: Self-pay | Admitting: Family Medicine

## 2016-02-09 MED ORDER — PANTOPRAZOLE SODIUM 40 MG PO TBEC: 40.0000 mg | DELAYED_RELEASE_TABLET | Freq: Every day | ORAL | Status: DC

## 2016-02-09 MED FILL — PANTOPRAZOLE SOD DR 40 MG T: 40 | 90 days supply | Qty: 90 | Fill #0

## 2016-02-09 NOTE — Telephone Encounter (Signed)
Refilled med per pt

## 2016-03-07 ENCOUNTER — Ambulatory Visit: Payer: 59

## 2016-03-16 ENCOUNTER — Ambulatory Visit
Admission: RE | Admit: 2016-03-16 | Discharge: 2016-03-16 | Disposition: A | Discharge status: Home / Self Care | Payer: 59 | Source: Ambulatory Visit

## 2016-03-16 DIAGNOSIS — Z1231 Encounter for screening mammogram for malignant neoplasm of breast: Secondary | ICD-10-CM | POA: Diagnosis not present

## 2016-05-02 MED FILL — PANTOPRAZOLE SOD DR 40 MG T: 40 | 90 days supply | Qty: 90 | Fill #1

## 2016-08-09 ENCOUNTER — Encounter: Payer: Self-pay | Admitting: Family Medicine

## 2016-08-09 MED ORDER — PANTOPRAZOLE SODIUM 40 MG PO TBEC: 40.0000 mg | DELAYED_RELEASE_TABLET | Freq: Every day | ORAL | 3 refills | Status: DC

## 2016-08-09 MED FILL — PANTOPRAZOLE SOD DR 40 MG T: 40 | 90 days supply | Qty: 90 | Fill #0

## 2016-09-15 ENCOUNTER — Ambulatory Visit: Payer: 59 | Admitting: Family Medicine

## 2016-11-17 MED FILL — PANTOPRAZOLE SOD DR 40 MG T: 40 | 90 days supply | Qty: 90 | Fill #1

## 2017-01-24 DIAGNOSIS — H5213 Myopia, bilateral: Secondary | ICD-10-CM | POA: Diagnosis not present

## 2017-01-24 DIAGNOSIS — H52222 Regular astigmatism, left eye: Secondary | ICD-10-CM | POA: Diagnosis not present

## 2017-02-09 ENCOUNTER — Encounter: Payer: Self-pay | Admitting: Family Medicine

## 2017-02-09 ENCOUNTER — Ambulatory Visit (INDEPENDENT_AMBULATORY_CARE_PROVIDER_SITE_OTHER): Payer: 59 | Admitting: Family Medicine

## 2017-02-09 VITALS — BP 120/70 | HR 75 | Ht 62.0 in | Wt 129.8 lb

## 2017-02-09 DIAGNOSIS — E7800 Pure hypercholesterolemia, unspecified: Secondary | ICD-10-CM

## 2017-02-09 DIAGNOSIS — Z8739 Personal history of other diseases of the musculoskeletal system and connective tissue: Secondary | ICD-10-CM

## 2017-02-09 DIAGNOSIS — Z1159 Encounter for screening for other viral diseases: Secondary | ICD-10-CM

## 2017-02-09 DIAGNOSIS — Z23 Encounter for immunization: Secondary | ICD-10-CM

## 2017-02-09 DIAGNOSIS — Z0000 Encounter for general adult medical examination without abnormal findings: Secondary | ICD-10-CM

## 2017-02-09 DIAGNOSIS — Z87898 Personal history of other specified conditions: Secondary | ICD-10-CM | POA: Diagnosis not present

## 2017-02-09 DIAGNOSIS — E2839 Other primary ovarian failure: Secondary | ICD-10-CM | POA: Diagnosis not present

## 2017-02-09 LAB — CBC WITH DIFFERENTIAL/PLATELET
BASOS ABS: 68 {cells}/uL (ref 0–200)
Basophils Relative: 1 %
Eosinophils Absolute: 204 cells/uL (ref 15–500)
Eosinophils Relative: 3 %
HCT: 40.3 % (ref 35.0–45.0)
HEMOGLOBIN: 13.4 g/dL (ref 11.7–15.5)
LYMPHS ABS: 2040 {cells}/uL (ref 850–3900)
Lymphocytes Relative: 30 %
MCH: 30.1 pg (ref 27.0–33.0)
MCHC: 33.3 g/dL (ref 32.0–36.0)
MCV: 90.6 fL (ref 80.0–100.0)
MPV: 10.3 fL (ref 7.5–12.5)
Monocytes Absolute: 680 cells/uL (ref 200–950)
Monocytes Relative: 10 %
Neutro Abs: 3808 cells/uL (ref 1500–7800)
Neutrophils Relative %: 56 %
PLATELETS: 233 10*3/uL (ref 140–400)
RBC: 4.45 MIL/uL (ref 3.80–5.10)
RDW: 13.2 % (ref 11.0–15.0)
WBC: 6.8 10*3/uL (ref 4.0–10.5)

## 2017-02-09 LAB — COMPREHENSIVE METABOLIC PANEL
ALT: 15 U/L (ref 6–29)
AST: 19 U/L (ref 10–35)
Albumin: 4.7 g/dL (ref 3.6–5.1)
Alkaline Phosphatase: 64 U/L (ref 33–130)
BILIRUBIN TOTAL: 0.9 mg/dL (ref 0.2–1.2)
BUN: 15 mg/dL (ref 7–25)
CHLORIDE: 104 mmol/L (ref 98–110)
CO2: 26 mmol/L (ref 20–31)
CREATININE: 0.92 mg/dL (ref 0.50–1.05)
Calcium: 9.5 mg/dL (ref 8.6–10.4)
Glucose, Bld: 103 mg/dL — ABNORMAL HIGH (ref 65–99)
Potassium: 4.1 mmol/L (ref 3.5–5.3)
SODIUM: 141 mmol/L (ref 135–146)
TOTAL PROTEIN: 7.6 g/dL (ref 6.1–8.1)

## 2017-02-09 LAB — LIPID PANEL
CHOL/HDL RATIO: 3.4 ratio (ref <5.0)
CHOLESTEROL: 293 mg/dL — AB (ref <200)
HDL: 85 mg/dL (ref >50)
LDL CALC: 183 mg/dL — AB (ref <100)
TRIGLYCERIDES: 126 mg/dL (ref <150)
VLDL: 25 mg/dL (ref <30)

## 2017-02-09 LAB — POCT URINALYSIS DIPSTICK
BILIRUBIN UA: NEGATIVE
Blood, UA: NEGATIVE
Glucose, UA: NEGATIVE
KETONES UA: NEGATIVE
Leukocytes, UA: NEGATIVE
NITRITE UA: NEGATIVE
PROTEIN UA: NEGATIVE
Spec Grav, UA: 1.02
Urobilinogen, UA: NEGATIVE
pH, UA: 6

## 2017-02-09 LAB — TSH: TSH: 3.13 m[IU]/L

## 2017-02-09 NOTE — Progress Notes (Signed)
Subjective:    Patient ID: Allison Calderon, female    DOB: 07/12/1961, 56 y.o.   MRN: [id number]  HPI Chief Complaint  Patient presents with  . fasting cpe    fasting cpe, no other concerns,  got eyes checked last year   She is here for a complete physical exam.  Would like to discuss a history of intermittent lower abdominal pain that is not present today. States approximately 1 1/2 years ago she had LLQ pain that felt like pressure and varied in intensity but was constant. Pain was non radiating. Laying supine seemed to worsen pain and certain positions would alleviate the pain. The pain resolved without intervention or work up. She then developed RLQ pain that lasted approximately 3 months and also resolved without intervention. She did see her OB/GYN for this at that time. They considered doing an [place] but decided to not do this. States she did not have any urinary symptoms and no changes in bowel habits or stool. Denies having abdominal pain over the past month.  She also noted having hot flashes around that time and those are gone.   No other concerns or complaints today.   Other providers: Dr. Carlean Purl- [facility]- optometrist Gustavo Lah - OB/GYN [facility] - annually   Past medical history: Prediabetes last A1c 5.8% Feb 2017 Lipids have been elevated in the past. No medication for this.  Osteopenia - will have a bone density test with her mammogram in the next month or so. States her OB/GYN will arrange this.   Diet:eating healthy Excerise: 5 - 6 days per week. Weight bearing, cardiovascular and strength training.   Immunizations: Tdap- needs this today.    Hep C- has not been tested in the past.   Health maintenance:  Mammogram: 03/16/2016 Colonoscopy: Dr. Carlean Purl- polyps. Last one March  Last Gynecological Exam: Last Menstrual cycle: June 2016 DEXA: 06/13/2014. Will get this.  Last Dental Exam: twice annually  Last Eye Exam: 2 weeks ago  Wears  seatbelt always, uses sunscreen, smoke detectors in home and functioning, does not text while driving and feels safe in home environment.   Reviewed allergies, medications, past medical, surgical, family, and social history.    Review of Systems Pertinent positives and negatives in the history of present illness.     Objective:   Physical Exam BP 120/70   Pulse 75   Ht 5\' 2"  (1.575 m)   Wt 129 lb 12.8 oz (58.9 kg)   BMI 23.74 kg/m   General Appearance:    Alert, cooperative, no distress, appears stated age  Head:    Normocephalic, without obvious abnormality, atraumatic  Eyes:    PERRL, conjunctiva/corneas clear, EOM's intact, fundi    benign  Ears:    Normal TM's and external ear canals  Nose:   Nares normal, mucosa normal, no drainage or sinus   tenderness  Throat:   Lips, mucosa, and tongue normal; teeth and gums normal  Neck:   Supple, no lymphadenopathy;  thyroid:  no   enlargement/tenderness/nodules; no carotid   bruit or JVD  Back:    Spine nontender, no curvature, ROM normal, no CVA     tenderness  Lungs:     Clear to auscultation bilaterally without wheezes, rales or     ronchi; respirations unlabored  Chest Wall:    No tenderness or deformity   Heart:    Regular rate and rhythm, S1 and S2 normal, no murmur, rub   or  gallop  Breast Exam:    Done at OB/GYN  Abdomen:     Soft, non-tender, nondistended, normoactive bowel sounds,    no masses, no hepatosplenomegaly  Genitalia:    Done at OB/GYN     Extremities:   No clubbing, cyanosis or edema  Pulses:   2+ and symmetric all extremities  Skin:   Skin color, texture, turgor normal, no rashes or lesions  Lymph nodes:   Cervical, supraclavicular, and axillary nodes normal  Neurologic:   CNII-XII intact, normal strength, sensation and gait; reflexes 2+ and symmetric throughout          Psych:   Normal mood, affect, hygiene and grooming.    Urinalysis dipstick: negative     Assessment & Plan:  Routine general medical  examination at a health care facility - Plan: Urinalysis Dipstick, CBC with Differential/Platelet, Comprehensive metabolic panel, TSH  Need for hepatitis C screening test - Plan: Hepatitis C antibody  Elevated LDL cholesterol level - Plan: Lipid panel  History of prediabetes - Plan: Hemoglobin A1c  History of osteopenia - Plan: VITAMIN D 25 Hydroxy (Vit-D Deficiency, Fractures)  Estrogen deficiency - Plan: VITAMIN D 25 Hydroxy (Vit-D Deficiency, Fractures)  Need for Tdap vaccination - Plan: Tdap vaccine greater than or equal to 7yo IM  She is taking good care of herself by eating healthy and exercising. Her job is stressful but she appears to be managing this well. Recommend she see her OB/GYN as scheduled. She will have mammogram and bone density. Continue taking calcium and vitamin D for bone health. Will check her vitamin D level today.  She will be due for colonoscopy in 2019.  Will test for Hep C per guidelines.  Tdap given Discussed whether she would like for me to pursue abdominal US or let her OB/GYN take care of this and she prefers to have her GYN do this. I think she is fine waiting since she has not had any abdominal pain in at least a month.  Follow up pending labs

## 2017-02-09 NOTE — Patient Instructions (Addendum)
You received your Tdap today.  Continue with healthy diet and exercise.  We will call you with lab results or send them through the patient portal if you have activated your account.  Follow up as scheduled with Allison Calderon.   Preventative Care for Adults - Female      MAINTAIN REGULAR HEALTH EXAMS:  A routine yearly physical is a good way to check in with your primary care provider about your health and preventive screening. It is also an opportunity to share updates about your health and any concerns you have, and receive a thorough all-over exam.   Most health insurance companies pay for at least some preventative services.  Check with your health plan for specific coverages.  WHAT PREVENTATIVE SERVICES DO WOMEN NEED?  Adult women should have their weight and blood pressure checked regularly.   Women age 46 and older should have their cholesterol levels checked regularly.  Women should be screened for cervical cancer with a Pap smear and pelvic exam beginning at either age 54, or 3 years after they become sexually activity.    Breast cancer screening generally begins at age 13 with a mammogram and breast exam by your primary care provider.    Beginning at age 35 and continuing to age 31, women should be screened for colorectal cancer.  Certain people may need continued testing until age 61.  Updating vaccinations is part of preventative care.  Vaccinations help protect against diseases such as the flu.  Osteoporosis is a disease in which the bones lose minerals and strength as we age. Women ages 45 and over should discuss this with their caregivers, as should women after menopause who have other risk factors.  Lab tests are generally done as part of preventative care to screen for anemia and blood disorders, to screen for problems with the kidneys and liver, to screen for bladder problems, to check blood sugar, and to check your cholesterol level.  Preventative services generally  include counseling about diet, exercise, avoiding tobacco, drugs, excessive alcohol consumption, and sexually transmitted infections.    GENERAL RECOMMENDATIONS FOR GOOD HEALTH:  Healthy diet:  Eat a variety of foods, including fruit, vegetables, animal or vegetable protein, such as meat, fish, chicken, and eggs, or beans, lentils, tofu, and grains, such as rice.  Drink plenty of water daily.  Decrease saturated fat in the diet, avoid lots of red meat, processed foods, sweets, fast foods, and fried foods.  Exercise:  Aerobic exercise helps maintain good heart health. At least 30-40 minutes of moderate-intensity exercise is recommended. For example, a brisk walk that increases your heart rate and breathing. This should be done on most days of the week.   Find a type of exercise or a variety of exercises that you enjoy so that it becomes a part of your daily life.  Examples are running, walking, swimming, water aerobics, and biking.  For motivation and support, explore group exercise such as aerobic class, spin class, Zumba, Yoga,or  martial arts, etc.    Set exercise goals for yourself, such as a certain weight goal, walk or run in a race such as a 5k walk/run.  Speak to your primary care provider about exercise goals.  Disease prevention:  If you smoke or chew tobacco, find out from your caregiver how to quit. It can literally save your life, no matter how long you have been a tobacco user. If you do not use tobacco, never begin.   Maintain a healthy diet and  normal weight. Increased weight leads to problems with blood pressure and diabetes.   The Body Mass Index or BMI is a way of measuring how much of your body is fat. Having a BMI above 27 increases the risk of heart disease, diabetes, hypertension, stroke and other problems related to obesity. Your caregiver can help determine your BMI and based on it develop an exercise and dietary program to help you achieve or maintain this important  measurement at a healthful level.  High blood pressure causes heart and blood vessel problems.  Persistent high blood pressure should be treated with medicine if weight loss and exercise do not work.   Fat and cholesterol leaves deposits in your arteries that can block them. This causes heart disease and vessel disease elsewhere in your body.  If your cholesterol is found to be high, or if you have heart disease or certain other medical conditions, then you may need to have your cholesterol monitored frequently and be treated with medication.   Ask if you should have a cardiac stress test if your history suggests this. A stress test is a test done on a treadmill that looks for heart disease. This test can find disease prior to there being a problem.  Menopause can be associated with physical symptoms and risks. Hormone replacement therapy is available to decrease these. You should talk to your caregiver about whether starting or continuing to take hormones is right for you.   Osteoporosis is a disease in which the bones lose minerals and strength as we age. This can result in serious bone fractures. Risk of osteoporosis can be identified using a bone density scan. Women ages 41 and over should discuss this with their caregivers, as should women after menopause who have other risk factors. Ask your caregiver whether you should be taking a calcium supplement and Vitamin D, to reduce the rate of osteoporosis.   Avoid drinking alcohol in excess (more than two drinks per day).  Avoid use of street drugs. Do not share needles with anyone. Ask for professional help if you need assistance or instructions on stopping the use of alcohol, cigarettes, and/or drugs.  Brush your teeth twice a day with fluoride toothpaste, and floss once a day. Good oral hygiene prevents tooth decay and gum disease. The problems can be painful, unattractive, and can cause other health problems. Visit your dentist for a routine oral  and dental check up and preventive care every 6-12 months.   Look at your skin regularly.  Use a mirror to look at your back. Notify your caregivers of changes in moles, especially if there are changes in shapes, colors, a size larger than a pencil eraser, an irregular border, or development of new moles.  Safety:  Use seatbelts 100% of the time, whether driving or as a passenger.  Use safety devices such as hearing protection if you work in environments with loud noise or significant background noise.  Use safety glasses when doing any work that could send debris in to the eyes.  Use a helmet if you ride a bike or motorcycle.  Use appropriate safety gear for contact sports.  Talk to your caregiver about gun safety.  Use sunscreen with a SPF (or skin protection factor) of 15 or greater.  Lighter skinned people are at a greater risk of skin cancer. Don't forget to also wear sunglasses in order to protect your eyes from too much damaging sunlight. Damaging sunlight can accelerate cataract formation.   Practice safe  sex. Use condoms. Condoms are used for birth control and to help reduce the spread of sexually transmitted infections (or STIs).  Some of the STIs are gonorrhea (the clap), chlamydia, syphilis, trichomonas, herpes, HPV (human papilloma virus) and HIV (human immunodeficiency virus) which causes AIDS. The herpes, HIV and HPV are viral illnesses that have no cure. These can result in disability, cancer and death.   Keep carbon monoxide and smoke detectors in your home functioning at all times. Change the batteries every 6 months or use a model that plugs into the wall.   Vaccinations:  Stay up to date with your tetanus shots and other required immunizations. You should have a booster for tetanus every 10 years. Be sure to get your flu shot every year, since 5%-20% of the [place] population comes down with the flu. The flu vaccine changes each year, so being vaccinated once is not enough. Get your  shot in the fall, before the flu season peaks.   Other vaccines to consider:  Human Papilloma Virus or HPV causes cancer of the cervix, and other infections that can be transmitted from person to person. There is a vaccine for HPV, and females should get immunized between the ages of 76 and 38. It requires a series of 3 shots.   Pneumococcal vaccine to protect against certain types of pneumonia.  This is normally recommended for adults age 72 or older.  However, adults younger than 58 years old with certain underlying conditions such as diabetes, heart or lung disease should also receive the vaccine.  Shingles vaccine to protect against Varicella Zoster if you are older than age 56, or younger than 58 years old with certain underlying illness.  Hepatitis A vaccine to protect against a form of infection of the liver by a virus acquired from food.  Hepatitis B vaccine to protect against a form of infection of the liver by a virus acquired from blood or body fluids, particularly if you work in health care.  If you plan to travel internationally, check with your local health department for specific vaccination recommendations.  Cancer Screening:  Breast cancer screening is essential to preventive care for women. All women age 25 and older should perform a breast self-exam every month. At age 15 and older, women should have their caregiver complete a breast exam each year. Women at ages 81 and older should have a mammogram (x-ray film) of the breasts. Your caregiver can discuss how often you need mammograms.    Cervical cancer screening includes taking a Pap smear (sample of cells examined under a microscope) from the cervix (end of the uterus). It also includes testing for HPV (Human Papilloma Virus, which can cause cervical cancer). Screening and a pelvic exam should begin at age 83, or 3 years after a woman becomes sexually active. Screening should occur every year, with a Pap smear but no HPV  testing, up to age 41. After age 19, you should have a Pap smear every 3 years with HPV testing, if no HPV was found previously.   Most routine colon cancer screening begins at the age of 31. On a yearly basis, doctors may provide special easy to use take-home tests to check for hidden blood in the stool. Sigmoidoscopy or colonoscopy can detect the earliest forms of colon cancer and is life saving. These tests use a small camera at the end of a tube to directly examine the colon. Speak to your caregiver about this at age 48, when routine screening  begins (and is repeated every 5 years unless early forms of pre-cancerous polyps or small growths are found).

## 2017-02-10 ENCOUNTER — Encounter: Payer: Self-pay | Admitting: Family Medicine

## 2017-02-10 ENCOUNTER — Other Ambulatory Visit: Payer: Self-pay | Admitting: Family Medicine

## 2017-02-10 DIAGNOSIS — Z8739 Personal history of other diseases of the musculoskeletal system and connective tissue: Secondary | ICD-10-CM

## 2017-02-10 DIAGNOSIS — E2839 Other primary ovarian failure: Secondary | ICD-10-CM

## 2017-02-10 DIAGNOSIS — Z1239 Encounter for other screening for malignant neoplasm of breast: Secondary | ICD-10-CM

## 2017-02-10 LAB — VITAMIN D 25 HYDROXY (VIT D DEFICIENCY, FRACTURES): Vit D, 25-Hydroxy: 33 ng/mL (ref 30–100)

## 2017-02-10 LAB — HEMOGLOBIN A1C
Hgb A1c MFr Bld: 5.2 % (ref <5.7)
Mean Plasma Glucose: 103 mg/dL

## 2017-02-10 LAB — HEPATITIS C ANTIBODY: HCV Ab: NEGATIVE

## 2017-02-10 MED ORDER — PANTOPRAZOLE SODIUM 40 MG PO TBEC: 40.0000 mg | DELAYED_RELEASE_TABLET | Freq: Every day | ORAL | 3 refills | Status: DC

## 2017-02-10 MED FILL — PANTOPRAZOLE SOD DR 40 MG T: 40 | 90 days supply | Qty: 90 | Fill #0

## 2017-02-10 NOTE — Telephone Encounter (Signed)
Please put order in for mammogram and bone density for pt. I have refilled her med for her.

## 2017-02-16 ENCOUNTER — Other Ambulatory Visit: Payer: Self-pay | Admitting: Family Medicine

## 2017-02-16 ENCOUNTER — Encounter: Payer: Self-pay | Admitting: Internal Medicine

## 2017-02-16 DIAGNOSIS — R109 Unspecified abdominal pain: Secondary | ICD-10-CM

## 2017-02-16 DIAGNOSIS — Z8279 Family history of other congenital malformations, deformations and chromosomal abnormalities: Secondary | ICD-10-CM

## 2017-02-21 ENCOUNTER — Ambulatory Visit
Admission: RE | Admit: 2017-02-21 | Discharge: 2017-02-21 | Disposition: A | Discharge status: Home / Self Care | Payer: 59 | Source: Ambulatory Visit | Attending: Family Medicine | Admitting: Family Medicine

## 2017-02-21 DIAGNOSIS — R109 Unspecified abdominal pain: Secondary | ICD-10-CM

## 2017-02-21 DIAGNOSIS — Z8279 Family history of other congenital malformations, deformations and chromosomal abnormalities: Secondary | ICD-10-CM

## 2017-02-21 MED ORDER — IOPAMIDOL (ISOVUE-300) INJECTION 61%
100.0000 mL | Freq: Once | INTRAVENOUS | Status: AC | PRN
  Administered 2017-02-21: 100 mL via INTRAVENOUS

## 2017-02-22 ENCOUNTER — Encounter: Payer: Self-pay | Admitting: Family Medicine

## 2017-03-22 ENCOUNTER — Ambulatory Visit
Admission: RE | Admit: 2017-03-22 | Discharge: 2017-03-22 | Disposition: A | Discharge status: Home / Self Care | Payer: 59 | Source: Ambulatory Visit | Attending: Family Medicine | Admitting: Family Medicine

## 2017-03-22 ENCOUNTER — Encounter: Payer: Self-pay | Admitting: Family Medicine

## 2017-03-22 DIAGNOSIS — Z780 Asymptomatic menopausal state: Secondary | ICD-10-CM | POA: Diagnosis not present

## 2017-03-22 DIAGNOSIS — Z1231 Encounter for screening mammogram for malignant neoplasm of breast: Secondary | ICD-10-CM | POA: Diagnosis not present

## 2017-03-22 DIAGNOSIS — E2839 Other primary ovarian failure: Secondary | ICD-10-CM

## 2017-03-22 DIAGNOSIS — Z1239 Encounter for other screening for malignant neoplasm of breast: Secondary | ICD-10-CM

## 2017-03-22 DIAGNOSIS — M8588 Other specified disorders of bone density and structure, other site: Secondary | ICD-10-CM | POA: Diagnosis not present

## 2017-03-22 DIAGNOSIS — M8580 Other specified disorders of bone density and structure, unspecified site: Secondary | ICD-10-CM | POA: Insufficient documentation

## 2017-03-22 DIAGNOSIS — Z8739 Personal history of other diseases of the musculoskeletal system and connective tissue: Secondary | ICD-10-CM

## 2017-05-16 ENCOUNTER — Encounter: Payer: Self-pay | Admitting: Family Medicine

## 2017-05-17 MED ORDER — PANTOPRAZOLE SODIUM 40 MG PO TBEC: 40.0000 mg | DELAYED_RELEASE_TABLET | Freq: Every day | ORAL | 3 refills | Status: DC

## 2017-06-01 ENCOUNTER — Telehealth: Payer: Self-pay | Admitting: Family Medicine

## 2017-06-01 NOTE — Telephone Encounter (Signed)
Called and let message for pt to come by and pick form up

## 2017-06-01 NOTE — Telephone Encounter (Signed)
Please call her. Form is ready.

## 2017-06-01 NOTE — Telephone Encounter (Signed)
Pt dropped off a form from [employer] to be completed and signed. Call pt at [phone] when form is ready to be picked up.

## 2017-10-16 ENCOUNTER — Encounter: Payer: Self-pay | Admitting: Family Medicine

## 2017-11-14 IMAGING — CT CT ABD-PELV W/ CM
3 of 5 series · 13 of 36 positions shown, 19 images · IV contrast (READICAT/WATER & [ID] ISOVUE 300)
Comparison: None.

CLINICAL DATA: Intermittent abdominal pain, family history of
neural fibromatosis

EXAM:
CT ABDOMEN AND PELVIS WITH CONTRAST
TECHNIQUE: Multidetector CT imaging of the abdomen and pelvis was performed
using the standard protocol following bolus administration of
intravenous contrast.
CONTRAST:  100mL [id number] IOPAMIDOL ([id number]) INJECTION 61%

[Series 3: abd/pelvis with · axial · 0.70mm/px · z∈[-360,-110]mm · 6 of 72 slices shown, 11 images]
[im 11/72  soft-tissue]
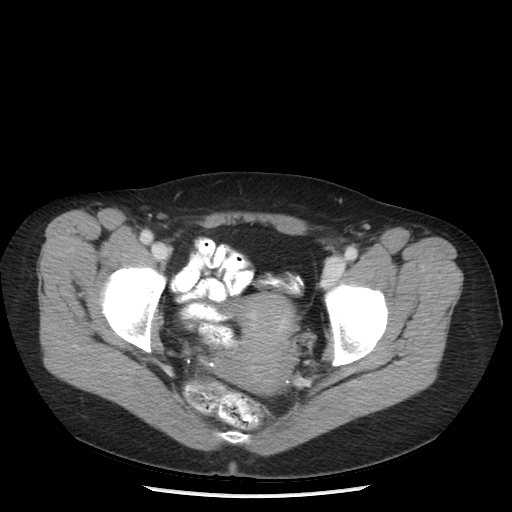
[im 11/72  bone]
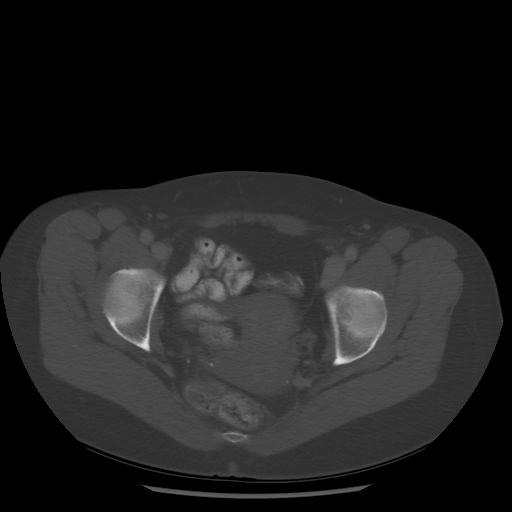
[im 21/72  soft-tissue]
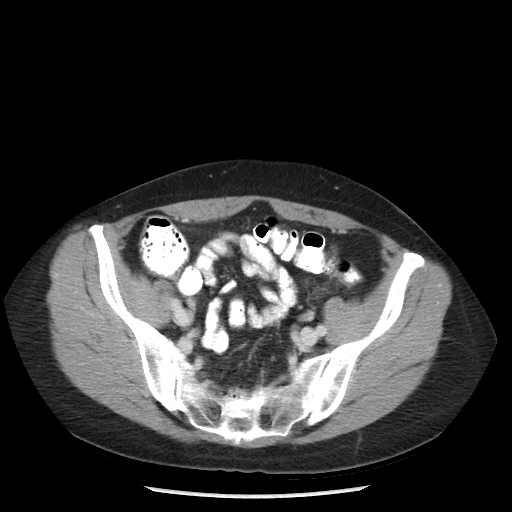
[im 31/72  soft-tissue]
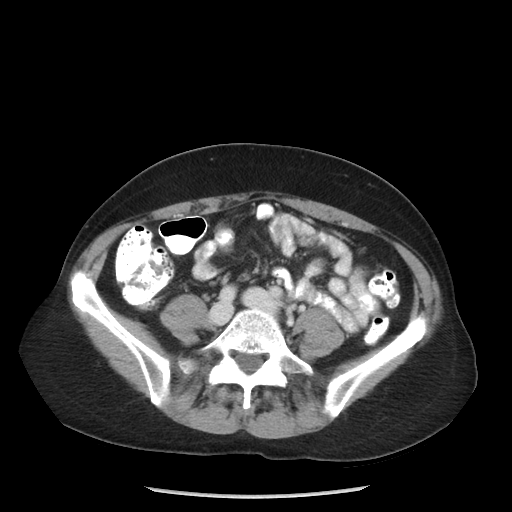
[im 31/72  lung]
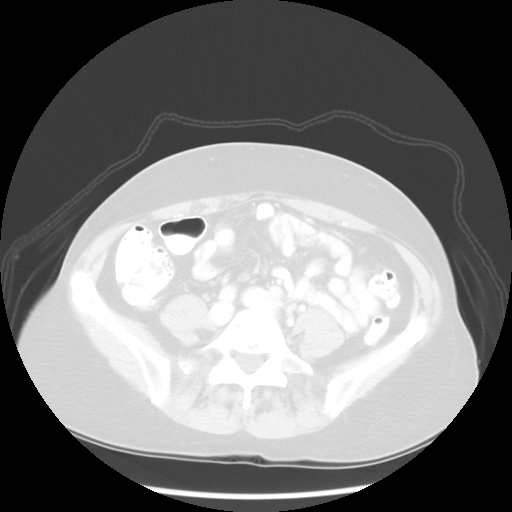
[im 41/72  soft-tissue]
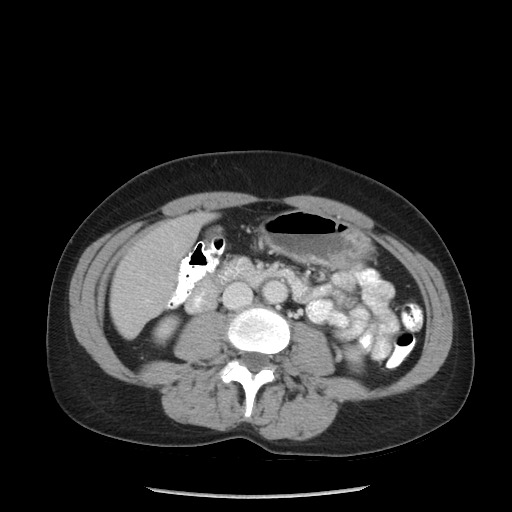
[im 41/72  lung]
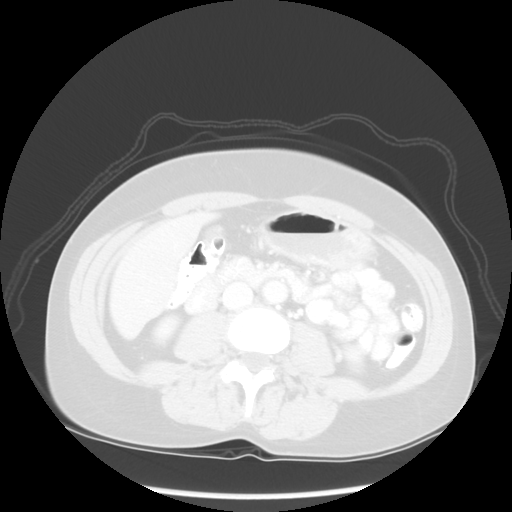
[im 51/72  soft-tissue]
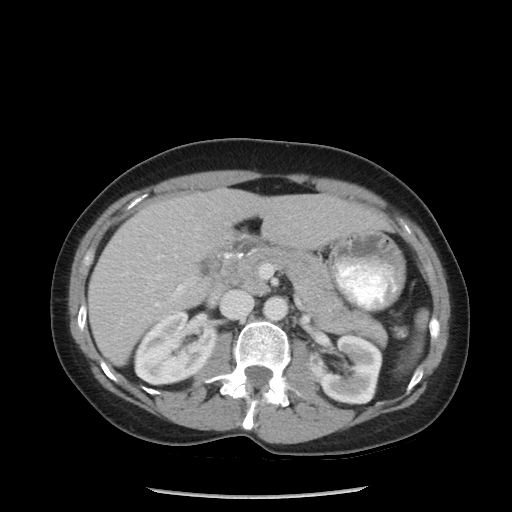
[im 51/72  lung]
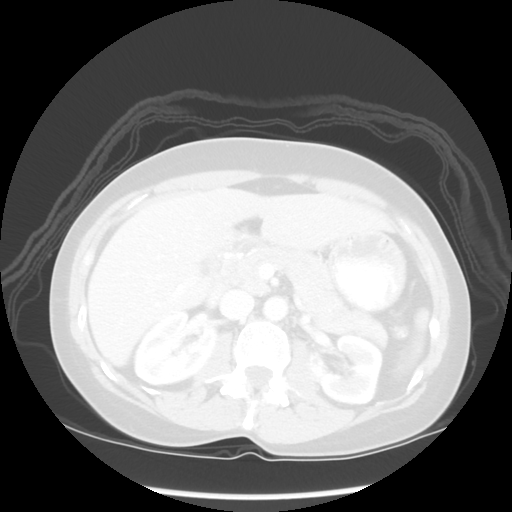
[im 61/72  soft-tissue]
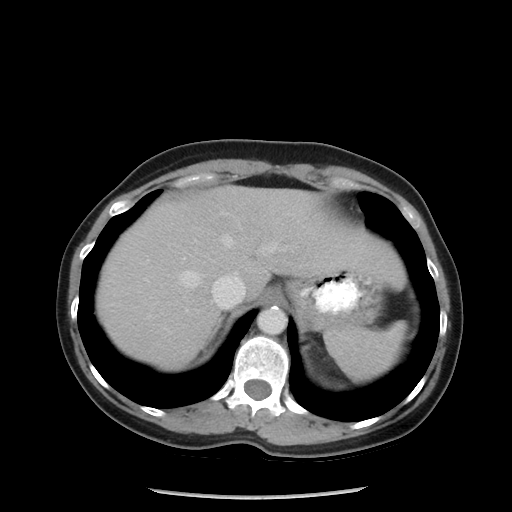
[im 61/72  lung]
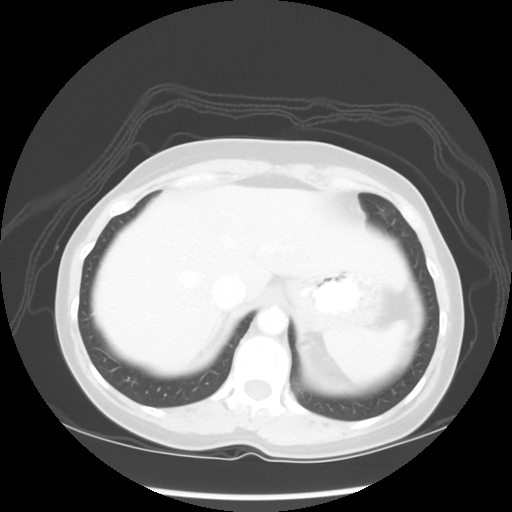

[Series 601: coronal body · coronal · 0.78mm/px · 1 of 107 slices shown, 2 images]
[im 36/107  soft-tissue]
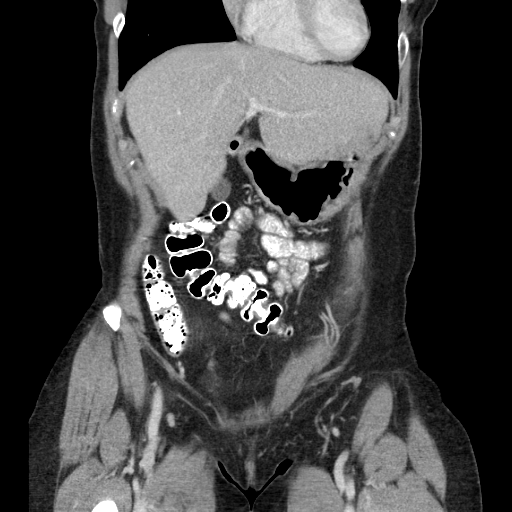
[im 36/107  bone]
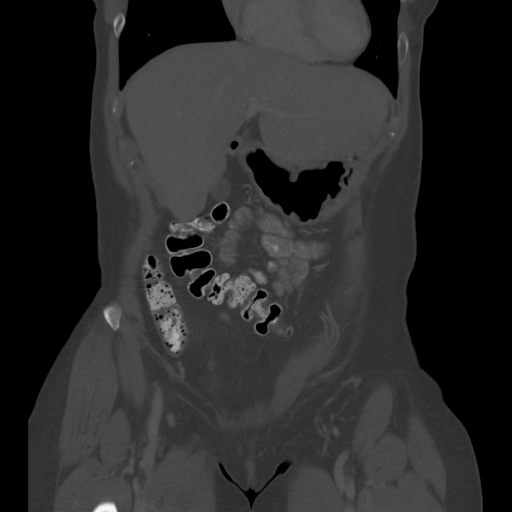

[Series 602: sagittal body · sagittal · 0.78mm/px · 6 of 145 slices shown]
[im 17/145  soft-tissue]
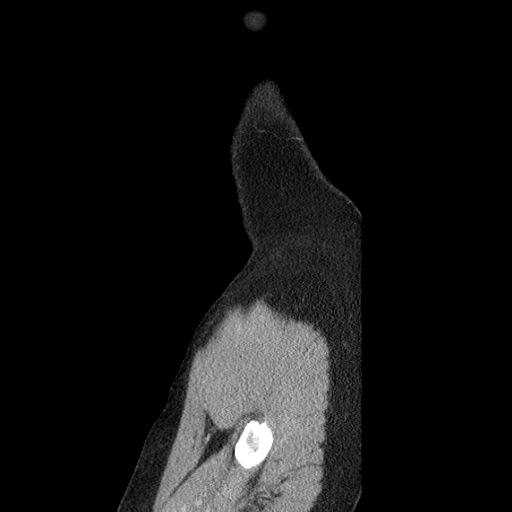
[im 34/145  soft-tissue]
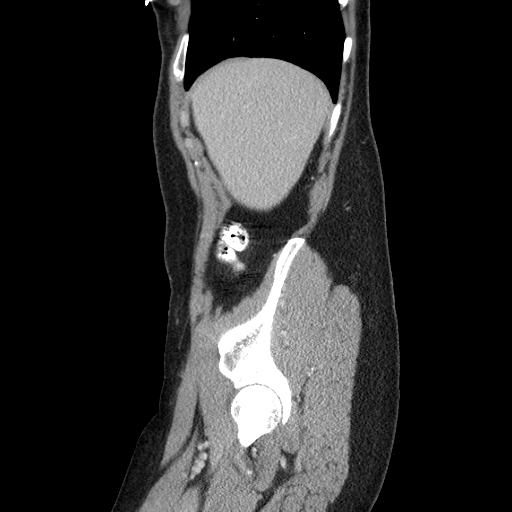
[im 51/145  soft-tissue]
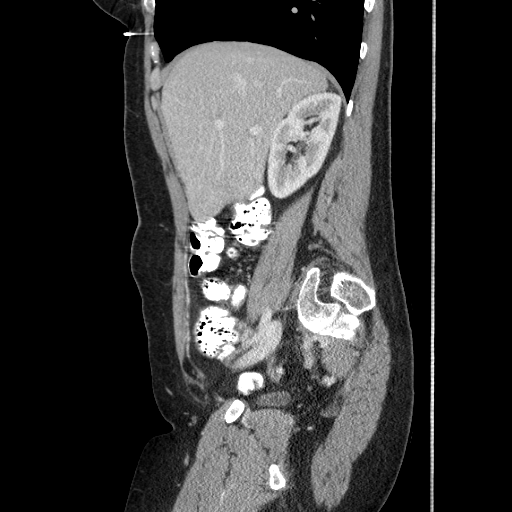
[im 68/145  soft-tissue]
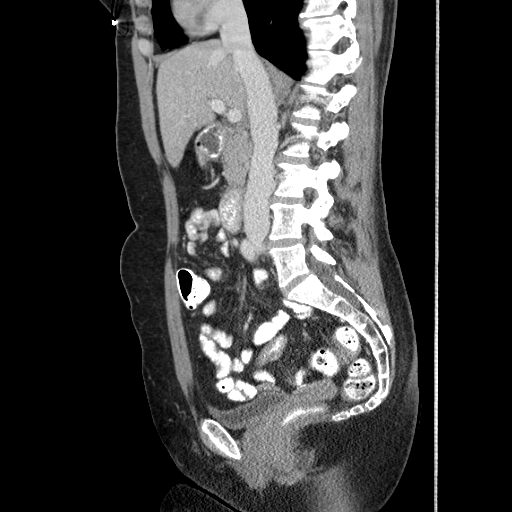
[im 85/145  soft-tissue]
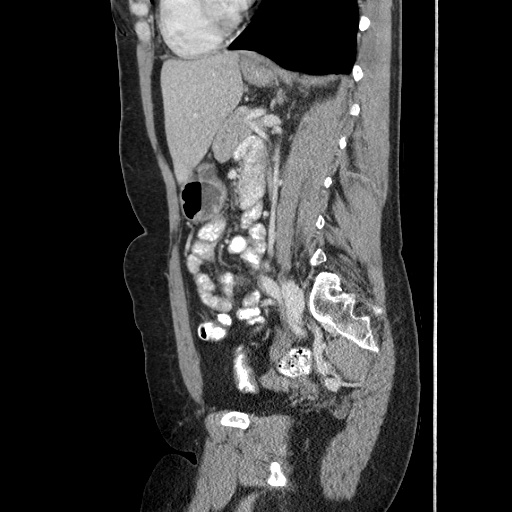
[im 102/145  soft-tissue]
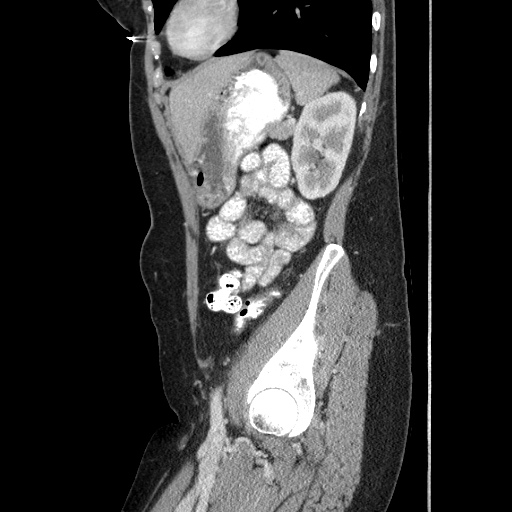

[13 of 36 positions shown; findings below may reference images not displayed]

FINDINGS: Lower chest: No acute abnormality.

Hepatobiliary: No focal liver abnormality is seen. No gallstones,
gallbladder wall thickening, or biliary dilatation.

Pancreas: Unremarkable. No pancreatic ductal dilatation or
surrounding inflammatory changes.

Spleen: Scattered calcified granulomas are noted consistent with
prior granulomatous disease.

Adrenals/Urinary Tract: Adrenal glands are unremarkable. Kidneys are
normal, without renal calculi, focal lesion, or hydronephrosis.
Bladder is unremarkable.

Stomach/Bowel: The appendix is not well visualized although no
inflammatory changes are noted. Correlate with any possible previous
surgery. No obstructive or inflammatory changes of the bowel are
seen.

Vascular/Lymphatic: No significant vascular findings are present. No
enlarged abdominal or pelvic lymph nodes.

Reproductive: Uterus and bilateral adnexa are unremarkable.

Other: No abdominal wall hernia or abnormality. No abdominopelvic
ascites.

Musculoskeletal: No acute or significant osseous findings.
IMPRESSION: No acute abnormality noted.

## 2018-01-18 DIAGNOSIS — Z01419 Encounter for gynecological examination (general) (routine) without abnormal findings: Secondary | ICD-10-CM | POA: Diagnosis not present

## 2018-01-18 DIAGNOSIS — Z6824 Body mass index (BMI) 24.0-24.9, adult: Secondary | ICD-10-CM | POA: Diagnosis not present

## 2018-02-15 ENCOUNTER — Encounter: Payer: Self-pay | Admitting: Family Medicine

## 2018-02-15 DIAGNOSIS — E7800 Pure hypercholesterolemia, unspecified: Secondary | ICD-10-CM | POA: Insufficient documentation

## 2018-02-15 NOTE — Progress Notes (Signed)
Subjective:    Patient ID: Allison Calderon, female    DOB: [birth year], 59 y.o.   MRN: [id number]  HPI Chief Complaint  Patient presents with  . fasting cpe    fasting cpe, no other concerns. had eye exam done february 2019   She is here for a complete physical exam.  Other providers: Dr. Carlean Purl- GI [facility] Otter- optometrist Gustavo Lah - OB/GYN [facility] - annually   States she has an appt in June with her dermatologist.   Past medical history: Prediabetes last A1c 5.2% Feb 2018 Lipids have been elevated in the past. No medication for this yet but her LDL is quite elevated.  Osteopenia - took fosamax in the past.   Has tried to skip days on PPI.  States reflux returned and caused her great discomfort when she was off of the medication.  She is currently taking Prilosec most days of the week.  States she is aware that this medication has potential long-term side effects and she would like to stop it.  She does watch her diet and try to avoid triggers.  Social history: Lives with husband , works for [employer] and Palliative  Diet: Healthy. Excerise: walks 6-7 days a week.   Immunizations: Tdap up to date. Shingrix?   Health maintenance:  Mammogram: 03/22/2017 and benign  Colonoscopy: due in March 2019 due to polyps DEXA: 03/22/2017  Last Gynecological Exam: Pap Smear 2 years ago.  Last Menstrual cycle: 2016 Last Dental Exam: up to date Last Eye Exam: up to date   Wears seatbelt always, uses sunscreen, smoke detectors in home and functioning, does not text while driving and feels safe in home environment.   Reviewed allergies, medications, past medical, surgical, family, and social history.   Review of Systems Review of Systems Constitutional: -fever, -chills, -sweats, -unexpected weight change,-fatigue ENT: -runny nose, -ear pain, -sore throat Cardiology:  -chest pain, -palpitations, -edema Respiratory: -cough, -shortness of breath,  -wheezing Gastroenterology: -abdominal pain, -nausea, -vomiting, -diarrhea, -constipation  Hematology: -bleeding or bruising problems Musculoskeletal: -arthralgias, -myalgias, -joint swelling, -back pain Ophthalmology: -vision changes Urology: -dysuria, -difficulty urinating, -hematuria, -urinary frequency, -urgency Neurology: -headache, -weakness, -tingling, -numbness       Objective:   Physical Exam BP 120/64   Pulse 68   Ht 5' 1.75" (1.568 m)   Wt 127 lb 9.6 oz (57.9 kg)   BMI 23.53 kg/m   General Appearance:    Alert, cooperative, no distress, appears stated age  Head:    Normocephalic, without obvious abnormality, atraumatic  Eyes:    PERRL, conjunctiva/corneas clear, EOM's intact, fundi    benign  Ears:    Normal TM's and external ear canals  Nose:   Nares normal, mucosa normal, no drainage or sinus   tenderness  Throat:   Lips, mucosa, and tongue normal; teeth and gums normal  Neck:   Supple, no lymphadenopathy;  thyroid:  no   enlargement/tenderness/nodules; no carotid   bruit or JVD  Back:    Spine nontender, no curvature, ROM normal, no CVA     tenderness  Lungs:     Clear to auscultation bilaterally without wheezes, rales or     ronchi; respirations unlabored  Chest Wall:    No tenderness or deformity   Heart:    Regular rate and rhythm, S1 and S2 normal, no murmur, rub   or gallop  Breast Exam:    Done at OB/GYN  Abdomen:     Soft, non-tender, nondistended, normoactive bowel  sounds,    no masses, no hepatosplenomegaly  Genitalia:    Done at OB/GYN     Extremities:   No clubbing, cyanosis or edema  Pulses:   2+ and symmetric all extremities  Skin:   Skin color, texture, turgor normal, no rashes or lesions  Lymph nodes:   Cervical, supraclavicular, and axillary nodes normal  Neurologic:   CNII-XII intact, normal strength, sensation and gait; reflexes 2+ and symmetric throughout          Psych:   Normal mood, affect, hygiene and grooming.    Urinalysis dipstick:  negative       Assessment & Plan:  Routine general medical examination at a health care facility - Plan: CBC with Differential/Platelet, Comprehensive metabolic panel, EKG 28-NOMV, TSH, Lipid panel, POCT Urinalysis DIP (Proadvantage Device)  Elevated LDL cholesterol level - Plan: Lipid panel  Personal history of colonic adenomas  Osteopenia determined by x-ray  History of prediabetes - Plan: Hemoglobin A1c  Gastroesophageal reflux disease, esophagitis presence not specified  Screening for HIV (human immunodeficiency virus) - Plan: HIV antibody  Screening for heart disease - Plan: EKG 12-Lead, Ambulatory referral to Cardiology  Family history of heart disease in female family member before age 72 - Plan: Ambulatory referral to Cardiology  Overall she appears to be doing quite well physically and emotionally.  She is extremely knowledgeable about her health and stays current on health prevention.  ECG NSR, rate 62. Questionable abnormal axis deviation in V2 unchanged compared to old ECG in 2015.  She is asymptomatic but based on this, elevated LDL and father with MI at age 59 I am referring her to cardiology for a baseline evaluation. She is ok with this.  Recheck of fasting lipids today. Discussed that we will re-evaluate her 10 year ASCVD risk and need for statin. I am in favor of her starting on a statin but she is reluctant.  She will call and schedule follow up colonoscopy. History of adenomas.  Prediabetes in the past with a normal Hgb A1c last year. Continue to monitor.  GERD- she has make attempts to cut back on PPI. I recommend she continue to do this and possibly add an H2 blocker if needed and space out the PPI as long as she can tolerate.  Overdue for HIV screening. This was ordered.  Pap smear up to date per patient. We will attempt to get this record. Immunizations up-to-date.  We did discuss the new shingles vaccine and she will check with her insurance company and see if  she is interested in getting this.  She is aware that it is currently on back order for our office. Follow-up pending labs.

## 2018-02-16 ENCOUNTER — Encounter: Payer: Self-pay | Admitting: Family Medicine

## 2018-02-16 ENCOUNTER — Ambulatory Visit (INDEPENDENT_AMBULATORY_CARE_PROVIDER_SITE_OTHER): Payer: 59 | Admitting: Family Medicine

## 2018-02-16 VITALS — BP 120/64 | HR 68 | Ht 61.75 in | Wt 127.6 lb

## 2018-02-16 DIAGNOSIS — Z87898 Personal history of other specified conditions: Secondary | ICD-10-CM

## 2018-02-16 DIAGNOSIS — M8580 Other specified disorders of bone density and structure, unspecified site: Secondary | ICD-10-CM

## 2018-02-16 DIAGNOSIS — Z8249 Family history of ischemic heart disease and other diseases of the circulatory system: Secondary | ICD-10-CM | POA: Diagnosis not present

## 2018-02-16 DIAGNOSIS — Z136 Encounter for screening for cardiovascular disorders: Secondary | ICD-10-CM | POA: Diagnosis not present

## 2018-02-16 DIAGNOSIS — Z114 Encounter for screening for human immunodeficiency virus [HIV]: Secondary | ICD-10-CM

## 2018-02-16 DIAGNOSIS — R69 Illness, unspecified: Secondary | ICD-10-CM | POA: Diagnosis not present

## 2018-02-16 DIAGNOSIS — K219 Gastro-esophageal reflux disease without esophagitis: Secondary | ICD-10-CM

## 2018-02-16 DIAGNOSIS — E7800 Pure hypercholesterolemia, unspecified: Secondary | ICD-10-CM

## 2018-02-16 DIAGNOSIS — Z0000 Encounter for general adult medical examination without abnormal findings: Secondary | ICD-10-CM

## 2018-02-16 DIAGNOSIS — Z86010 Personal history of colonic polyps: Secondary | ICD-10-CM | POA: Diagnosis not present

## 2018-02-16 LAB — POCT URINALYSIS DIP (PROADVANTAGE DEVICE)
Bilirubin, UA: NEGATIVE
Glucose, UA: NEGATIVE mg/dL
Ketones, POC UA: NEGATIVE mg/dL
Leukocytes, UA: NEGATIVE
NITRITE UA: NEGATIVE
Protein Ur, POC: NEGATIVE mg/dL
RBC UA: NEGATIVE
Specific Gravity, Urine: 1.025
Urobilinogen, Ur: NEGATIVE
pH, UA: 6 (ref 5.0–8.0)

## 2018-02-16 NOTE — Patient Instructions (Addendum)
Try taking the PPI every other day and supplementing with Zantac.  Call and check with your insurance regarding Shingrix vaccine.   Continue taking great care of yourself.   We will call you with your lab results.   Preventative Care for Adults - Female      MAINTAIN REGULAR HEALTH EXAMS:  A routine yearly physical is a good way to check in with your primary care provider about your health and preventive screening. It is also an opportunity to share updates about your health and any concerns you have, and receive a thorough all-over exam.   Most health insurance companies pay for at least some preventative services.  Check with your health plan for specific coverages.  WHAT PREVENTATIVE SERVICES DO WOMEN NEED?  Adult women should have their weight and blood pressure checked regularly.   Women age 59 and older should have their cholesterol levels checked regularly.  Women should be screened for cervical cancer with a Pap smear and pelvic exam beginning at age 73.  Breast cancer screening generally begins at age 59 with a mammogram and breast exam by your primary care provider.    Beginning at age 59 and continuing to age 59, women should be screened for colorectal cancer.  Certain people may need continued testing until age 59.  Updating vaccinations is part of preventative care.  Vaccinations help protect against diseases such as the flu.  Osteoporosis is a disease in which the bones lose minerals and strength as we age. Women ages 59 and over should discuss this with their caregivers, as should women after menopause who have other risk factors.  Lab tests are generally done as part of preventative care to screen for anemia and blood disorders, to screen for problems with the kidneys and liver, to screen for bladder problems, to check blood sugar, and to check your cholesterol level.  Preventative services generally include counseling about diet, exercise, avoiding tobacco, drugs,  excessive alcohol consumption, and sexually transmitted infections.    GENERAL RECOMMENDATIONS FOR GOOD HEALTH:  Healthy diet:  Eat a variety of foods, including fruit, vegetables, animal or vegetable protein, such as meat, fish, chicken, and eggs, or beans, lentils, tofu, and grains, such as rice.  Drink plenty of water daily.  Decrease saturated fat in the diet, avoid lots of red meat, processed foods, sweets, fast foods, and fried foods.  Exercise:  Aerobic exercise helps maintain good heart health. At least 30-40 minutes of moderate-intensity exercise is recommended. For example, a brisk walk that increases your heart rate and breathing. This should be done on most days of the week.   Find a type of exercise or a variety of exercises that you enjoy so that it becomes a part of your daily life.  Examples are running, walking, swimming, water aerobics, and biking.  For motivation and support, explore group exercise such as aerobic class, spin class, Zumba, Yoga,or  martial arts, etc.    Set exercise goals for yourself, such as a certain weight goal, walk or run in a race such as a 5k walk/run.  Speak to your primary care provider about exercise goals.  Disease prevention:  If you smoke or chew tobacco, find out from your caregiver how to quit. It can literally save your life, no matter how long you have been a tobacco user. If you do not use tobacco, never begin.   Maintain a healthy diet and normal weight. Increased weight leads to problems with blood pressure and diabetes.  The Body Mass Index or BMI is a way of measuring how much of your body is fat. Having a BMI above 27 increases the risk of heart disease, diabetes, hypertension, stroke and other problems related to obesity. Your caregiver can help determine your BMI and based on it develop an exercise and dietary program to help you achieve or maintain this important measurement at a healthful level.  High blood pressure causes  heart and blood vessel problems.  Persistent high blood pressure should be treated with medicine if weight loss and exercise do not work.   Fat and cholesterol leaves deposits in your arteries that can block them. This causes heart disease and vessel disease elsewhere in your body.  If your cholesterol is found to be high, or if you have heart disease or certain other medical conditions, then you may need to have your cholesterol monitored frequently and be treated with medication.   Ask if you should have a cardiac stress test if your history suggests this. A stress test is a test done on a treadmill that looks for heart disease. This test can find disease prior to there being a problem.  Menopause can be associated with physical symptoms and risks. Hormone replacement therapy is available to decrease these. You should talk to your caregiver about whether starting or continuing to take hormones is right for you.   Osteoporosis is a disease in which the bones lose minerals and strength as we age. This can result in serious bone fractures. Risk of osteoporosis can be identified using a bone density scan. Women ages 59 and over should discuss this with their caregivers, as should women after menopause who have other risk factors. Ask your caregiver whether you should be taking a calcium supplement and Vitamin D, to reduce the rate of osteoporosis.   Avoid drinking alcohol in excess (more than two drinks per day).  Avoid use of street drugs. Do not share needles with anyone. Ask for professional help if you need assistance or instructions on stopping the use of alcohol, cigarettes, and/or drugs.  Brush your teeth twice a day with fluoride toothpaste, and floss once a day. Good oral hygiene prevents tooth decay and gum disease. The problems can be painful, unattractive, and can cause other health problems. Visit your dentist for a routine oral and dental check up and preventive care every 6-12 months.    Look at your skin regularly.  Use a mirror to look at your back. Notify your caregivers of changes in moles, especially if there are changes in shapes, colors, a size larger than a pencil eraser, an irregular border, or development of new moles.  Safety:  Use seatbelts 100% of the time, whether driving or as a passenger.  Use safety devices such as hearing protection if you work in environments with loud noise or significant background noise.  Use safety glasses when doing any work that could send debris in to the eyes.  Use a helmet if you ride a bike or motorcycle.  Use appropriate safety gear for contact sports.  Talk to your caregiver about gun safety.  Use sunscreen with a SPF (or skin protection factor) of 15 or greater.  Lighter skinned people are at a greater risk of skin cancer. Don't forget to also wear sunglasses in order to protect your eyes from too much damaging sunlight. Damaging sunlight can accelerate cataract formation.   Practice safe sex. Use condoms. Condoms are used for birth control and to help reduce the  spread of sexually transmitted infections (or STIs).  Some of the STIs are gonorrhea (the clap), chlamydia, syphilis, trichomonas, herpes, HPV (human papilloma virus) and HIV (human immunodeficiency virus) which causes AIDS. The herpes, HIV and HPV are viral illnesses that have no cure. These can result in disability, cancer and death.   Keep carbon monoxide and smoke detectors in your home functioning at all times. Change the batteries every 6 months or use a model that plugs into the wall.   Vaccinations:  Stay up to date with your tetanus shots and other required immunizations. You should have a booster for tetanus every 10 years. Be sure to get your flu shot every year, since 5%-20% of the [place] population comes down with the flu. The flu vaccine changes each year, so being vaccinated once is not enough. Get your shot in the fall, before the flu season peaks.   Other  vaccines to consider:  Human Papilloma Virus or HPV causes cancer of the cervix, and other infections that can be transmitted from person to person. There is a vaccine for HPV, and females should get immunized between the ages of 81 and 50. It requires a series of 3 shots.   Pneumococcal vaccine to protect against certain types of pneumonia.  This is normally recommended for adults age 1 or older.  However, adults younger than 59 years old with certain underlying conditions such as diabetes, heart or lung disease should also receive the vaccine.  Shingles vaccine to protect against Varicella Zoster if you are older than age 35, or younger than 59 years old with certain underlying illness.  Hepatitis A vaccine to protect against a form of infection of the liver by a virus acquired from food.  Hepatitis B vaccine to protect against a form of infection of the liver by a virus acquired from blood or body fluids, particularly if you work in health care.  If you plan to travel internationally, check with your local health department for specific vaccination recommendations.  Cancer Screening:  Breast cancer screening is essential to preventive care for women. All women age 59 and older should perform a breast self-exam every month. At age 3 and older, women should have their caregiver complete a breast exam each year. Women at ages 67 and older should have a mammogram (x-ray film) of the breasts. Your caregiver can discuss how often you need mammograms.    Cervical cancer screening includes taking a Pap smear (sample of cells examined under a microscope) from the cervix (end of the uterus). It also includes testing for HPV (Human Papilloma Virus, which can cause cervical cancer). Screening and a pelvic exam should begin at age 58, or 3 years after a woman becomes sexually active. Screening should occur every year, with a Pap smear but no HPV testing, up to age 70. After age 63, you should have a Pap  smear every 3 years with HPV testing, if no HPV was found previously.   Most routine colon cancer screening begins at the age of 98. On a yearly basis, doctors may provide special easy to use take-home tests to check for hidden blood in the stool. Sigmoidoscopy or colonoscopy can detect the earliest forms of colon cancer and is life saving. These tests use a small camera at the end of a tube to directly examine the colon. Speak to your caregiver about this at age 64, when routine screening begins (and is repeated every 5 years unless early forms of pre-cancerous polyps or  small growths are found).

## 2018-02-17 LAB — CBC WITH DIFFERENTIAL/PLATELET
BASOS ABS: 0.1 10*3/uL (ref 0.0–0.2)
Basos: 1 %
EOS (ABSOLUTE): 0.2 10*3/uL (ref 0.0–0.4)
EOS: 3 %
HEMATOCRIT: 40.1 % (ref 34.0–46.6)
Hemoglobin: 13.6 g/dL (ref 11.1–15.9)
IMMATURE GRANULOCYTES: 0 %
Immature Grans (Abs): 0 10*3/uL (ref 0.0–0.1)
LYMPHS ABS: 1.6 10*3/uL (ref 0.7–3.1)
Lymphs: 31 %
MCH: 30.7 pg (ref 26.6–33.0)
MCHC: 33.9 g/dL (ref 31.5–35.7)
MCV: 91 fL (ref 79–97)
MONOS ABS: 0.4 10*3/uL (ref 0.1–0.9)
Monocytes: 9 %
NEUTROS PCT: 56 %
Neutrophils Absolute: 2.8 10*3/uL (ref 1.4–7.0)
PLATELETS: 213 10*3/uL (ref 150–379)
RBC: 4.43 x10E6/uL (ref 3.77–5.28)
RDW: 13.3 % (ref 12.3–15.4)
WBC: 5 10*3/uL (ref 3.4–10.8)

## 2018-02-17 LAB — COMPREHENSIVE METABOLIC PANEL
ALK PHOS: 71 IU/L (ref 39–117)
ALT: 12 IU/L (ref 0–32)
AST: 19 IU/L (ref 0–40)
Albumin/Globulin Ratio: 1.8 (ref 1.2–2.2)
Albumin: 4.7 g/dL (ref 3.5–5.5)
BUN/Creatinine Ratio: 12 (ref 9–23)
BUN: 12 mg/dL (ref 6–24)
Bilirubin Total: 0.6 mg/dL (ref 0.0–1.2)
CALCIUM: 9.7 mg/dL (ref 8.7–10.2)
CO2: 24 mmol/L (ref 20–29)
CREATININE: 0.97 mg/dL (ref 0.57–1.00)
Chloride: 104 mmol/L (ref 96–106)
GFR calc Af Amer: 74 mL/min/{1.73_m2} (ref >59)
GFR, EST NON AFRICAN AMERICAN: 65 mL/min/{1.73_m2} (ref >59)
GLOBULIN, TOTAL: 2.6 g/dL (ref 1.5–4.5)
Glucose: 98 mg/dL (ref 65–99)
POTASSIUM: 4.5 mmol/L (ref 3.5–5.2)
SODIUM: 143 mmol/L (ref 134–144)
Total Protein: 7.3 g/dL (ref 6.0–8.5)

## 2018-02-17 LAB — LIPID PANEL
CHOLESTEROL TOTAL: 249 mg/dL — AB (ref 100–199)
Chol/HDL Ratio: 2.9 ratio (ref 0.0–4.4)
HDL: 86 mg/dL (ref >39)
LDL Calculated: 147 mg/dL — ABNORMAL HIGH (ref 0–99)
Triglycerides: 80 mg/dL (ref 0–149)
VLDL Cholesterol Cal: 16 mg/dL (ref 5–40)

## 2018-02-17 LAB — HEMOGLOBIN A1C
ESTIMATED AVERAGE GLUCOSE: 111 mg/dL
HEMOGLOBIN A1C: 5.5 % (ref 4.8–5.6)

## 2018-02-17 LAB — TSH: TSH: 3.35 u[IU]/mL (ref 0.450–4.500)

## 2018-02-17 LAB — HIV ANTIBODY (ROUTINE TESTING W REFLEX): HIV Screen 4th Generation wRfx: NONREACTIVE

## 2018-02-19 ENCOUNTER — Encounter: Payer: Self-pay | Admitting: Internal Medicine

## 2018-02-23 ENCOUNTER — Other Ambulatory Visit: Payer: Self-pay | Admitting: Family Medicine

## 2018-02-23 DIAGNOSIS — Z1231 Encounter for screening mammogram for malignant neoplasm of breast: Secondary | ICD-10-CM

## 2018-02-26 ENCOUNTER — Telehealth: Payer: Self-pay | Admitting: Family Medicine

## 2018-02-26 NOTE — Telephone Encounter (Signed)
Received requested pap 02/23/2018. Sending back for review.

## 2018-03-01 ENCOUNTER — Encounter: Payer: Self-pay | Admitting: Internal Medicine

## 2018-03-16 ENCOUNTER — Encounter: Payer: Self-pay | Admitting: Family Medicine

## 2018-04-16 ENCOUNTER — Ambulatory Visit
Admission: RE | Admit: 2018-04-16 | Discharge: 2018-04-16 | Disposition: A | Discharge status: Home / Self Care | Payer: 59 | Source: Ambulatory Visit | Attending: Family Medicine | Admitting: Family Medicine

## 2018-04-16 DIAGNOSIS — Z1231 Encounter for screening mammogram for malignant neoplasm of breast: Secondary | ICD-10-CM

## 2018-04-19 ENCOUNTER — Encounter: Payer: Self-pay | Admitting: Cardiovascular Disease

## 2018-04-19 ENCOUNTER — Ambulatory Visit (INDEPENDENT_AMBULATORY_CARE_PROVIDER_SITE_OTHER): Payer: 59 | Admitting: Cardiovascular Disease

## 2018-04-19 VITALS — BP 112/64 | HR 68 | Ht 62.0 in | Wt 123.4 lb

## 2018-04-19 DIAGNOSIS — Z8249 Family history of ischemic heart disease and other diseases of the circulatory system: Secondary | ICD-10-CM | POA: Diagnosis not present

## 2018-04-19 DIAGNOSIS — E785 Hyperlipidemia, unspecified: Secondary | ICD-10-CM | POA: Diagnosis not present

## 2018-04-19 NOTE — Patient Instructions (Addendum)
Medication Instructions:  Your physician recommends that you continue on your current medications as directed. Please refer to the Current Medication list given to you today.  Labwork: none  Testing/Procedures: CALCIUM SCORE $150 OUT OF POCKET, YOUR INSURANCE WILL NOT BE BILLED  [facility] AT [address]  Follow-Up: Your physician wants you to follow-up in: 1 [place] will receive a reminder letter in the mail two months in advance. If you don't receive a letter, please call our office to schedule the follow-up appointment.  If you need a refill on your cardiac medications before your next appointment, please call your pharmacy.

## 2018-04-19 NOTE — Progress Notes (Signed)
Cardiology Office Note   Date:  04/19/2018   ID:  Allison Calderon, DOB 04-Jul-1961, MRN [id number]  PCP:  Allison Rm, NP-C  Cardiologist:   Allison Latch, MD   Chief Complaint  Patient presents with  . New Patient (Initial Visit)      History of Present Illness: Allison Calderon is a 57 y.o. female with prediabetes, hyperlipidemia and GERD who is being seen today for the evaluation of cardiovascular risk assessment at the request of Henson, Vickie L, NP-C.   Allison Calderon saw Allison Dingwall, NP-C, on 02/2018 and was feeling well.  However it was noted that she had a family history of premature CAD and was referred to cardiology for further evaluation.  Allison Calderon has been well.  She walks 40 minutes each morning and has no exertional symptoms.  She has not needs any chest pain or shortness of breath.  She denies lower extremity edema, orthopnea, or PND.  She does have occasional episodes of heart fluttering.  It happens approximately 3 or 4 times per year, typically when she is lying down.  She also sometimes awakens from her sleep with palpitations that last for approximately a minute or 2.  She did not get lightheaded or dizzy and she has no associated shortness of breath.  She struggles with acid reflux.  She has been working to get her cholesterol under better control.  She started taking flaxseed and eating oatmeal.  She is very minimal red meat and rarely has fried or fatty foods.  Her father had his first heart attack at age 80 and subsequently required stenting and mitral valve replacement.  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   Past Medical History:  Diagnosis Date  . Allergy   . BREAST LUMP   . Elevated LDL cholesterol level   . GERD (gastroesophageal reflux disease)   . History of chicken pox   . Hx: UTI (urinary tract infection)   . Osteoarthritis, knee    R>Calderon  . Personal history of colonic adenomas 03/13/2013   02/2013 - 2 diminutive adenomas     Past Surgical History:  Procedure Laterality Date  . COLONOSCOPY  02/2013     Current Outpatient Medications  Medication Sig Dispense Refill  . Calcium Carbonate-Vitamin D (CALTRATE 600+D PO) Take 1 tablet by mouth daily.    . Cimetidine (TAGAMET PO) Take by mouth daily.    Marland Kitchen omeprazole (PRILOSEC) 40 MG capsule Take 40 mg by mouth daily.     No current facility-administered medications for this visit.     Allergies:   Patient has no known allergies.    Social History:  The patient  reports that she has quit smoking. Her smoking use included cigarettes. She quit after 30.00 years of use. She quit smokeless tobacco use about 39 years ago. She reports that she drinks alcohol. She reports that she does not use drugs.   Family History:  The patient's family history includes Arthritis in her mother; Barrett's esophagus in her brother; Bladder Cancer in her mother; Breast cancer in her maternal aunt; CAD in her father; Cancer in her father; Dementia in her maternal grandmother; Diabetes in her father; Esophageal cancer in her paternal uncle; Gallbladder disease in her father; Glaucoma in her mother; Heart disease in her father; Hyperlipidemia in her brother and father; Hypertension in her paternal grandfather; Osteoarthritis in her mother; Stroke in her paternal grandfather and paternal grandmother; Valvular heart disease in her  father.    ROS:  Please see the history of present illness.   Otherwise, review of systems are positive for none.   All other systems are reviewed and negative.    PHYSICAL EXAM: VS:  BP 112/64   Pulse 68   Ht 5\' 2"  (1.575 m)   Wt 123 lb 6.4 oz (56 kg)   BMI 22.57 kg/m  , BMI Body mass index is 22.57 kg/m. GENERAL:  Well appearing HEENT:  Pupils equal round and reactive, fundi not visualized, oral mucosa unremarkable NECK:  No jugular venous distention, waveform within normal limits, carotid upstroke brisk and symmetric, no bruits LUNGS:  Clear to  auscultation bilaterally HEART:  RRR.  PMI not displaced or sustained,S1 and S2 within normal limits, no S3, no S4, no clicks, no rubs, I/VI systolic ejection murmur ABD:  Flat, positive bowel sounds normal in frequency in pitch, no bruits, no rebound, no guarding, no midline pulsatile mass, no hepatomegaly, no splenomegaly EXT:  2 plus pulses throughout, no edema, no cyanosis no clubbing SKIN:  No rashes no nodules NEURO:  Cranial nerves II through XII grossly intact, motor grossly intact throughout PSYCH:  Cognitively intact, oriented to person place and time    EKG:  EKG is ordered today. The ekg ordered today demonstrates sinus rhythm.  Rate 68 bpm.   Recent Labs: 02/16/2018: ALT 12; BUN 12; Creatinine, Ser 0.97; Hemoglobin 13.6; Platelets 213; Potassium 4.5; Sodium 143; TSH 3.350    Lipid Panel    Component Value Date/Time   CHOL 249 (H) 02/16/2018 0852   TRIG 80 02/16/2018 0852   HDL 86 02/16/2018 0852   CHOLHDL 2.9 02/16/2018 0852   CHOLHDL 3.4 02/09/2017 0854   VLDL 25 02/09/2017 0854   LDLCALC 147 (H) 02/16/2018 0852   LDLDIRECT 175.8 12/24/2013 0846      Wt Readings from Last 3 Encounters:  04/19/18 123 lb 6.4 oz (56 kg)  02/16/18 127 lb 9.6 oz (57.9 kg)  02/09/17 129 lb 12.8 oz (58.9 kg)      ASSESSMENT AND PLAN:  # Family history of CAD:  # Hyperlipidemia:  [name] likely has heterozygous FH.  She has done a good job of getting her cholesterol lowered with exercise, diet, and supplements.  Her ASCVD ten-year risk is 1.8%.  However this likely underestimates her overall risk as she has a family history of CAD and strokes.  She is interested in getting a coronary calcium score to better understand her true risk.  For now continue with diet and exercise.  Given that she does not have any exertional symptoms we will not pursue ischemia evaluation at this time   Current medicines are reviewed at length with the patient today.  The patient does not have concerns  regarding medicines.  The following changes have been made:  no change  Labs/ tests ordered today include:   Orders Placed This Encounter  Procedures  . CT CARDIAC SCORING  . EKG 12-Lead     Disposition:   FU with Allison Leyh C. Oval Linsey, MD, [facility] in 1 year.     Signed, Raeshawn Tafolla C. Oval Linsey, MD, [facility]  04/19/2018 9:09 AM    [place]

## 2018-04-26 ENCOUNTER — Telehealth: Payer: Self-pay | Admitting: Family Medicine

## 2018-04-26 NOTE — Telephone Encounter (Signed)
Pt was notified. Pt's form is ready for her to pick up and I have faxed it back

## 2018-04-26 NOTE — Telephone Encounter (Signed)
Pt came in and dropped off a cpe form. Pt did have a cpe on 02/16/2018. Pt requested form be given to [place]. Pt has written instructions to sabrina on form that states to fax to [phone] and call pt to pick up. Pt can be reached at [phone].

## 2018-05-01 ENCOUNTER — Ambulatory Visit (AMBULATORY_SURGERY_CENTER): Payer: Self-pay

## 2018-05-01 ENCOUNTER — Other Ambulatory Visit: Payer: Self-pay

## 2018-05-01 VITALS — Ht 62.0 in | Wt 123.4 lb

## 2018-05-01 DIAGNOSIS — Z86010 Personal history of colonic polyps: Secondary | ICD-10-CM

## 2018-05-01 NOTE — Progress Notes (Signed)
Denies allergies to eggs or soy products. Denies complication of anesthesia or sedation. Denies use of weight loss medication. Denies use of O2.   Emmi instructions declined.  

## 2018-05-02 ENCOUNTER — Ambulatory Visit (INDEPENDENT_AMBULATORY_CARE_PROVIDER_SITE_OTHER)
Admission: RE | Admit: 2018-05-02 | Discharge: 2018-05-02 | Disposition: A | Discharge status: Home / Self Care | Payer: Self-pay | Source: Ambulatory Visit | Attending: Cardiovascular Disease | Admitting: Cardiovascular Disease

## 2018-05-02 DIAGNOSIS — Z8249 Family history of ischemic heart disease and other diseases of the circulatory system: Secondary | ICD-10-CM

## 2018-05-09 ENCOUNTER — Encounter: Payer: Self-pay | Admitting: Internal Medicine

## 2018-05-15 ENCOUNTER — Ambulatory Visit (AMBULATORY_SURGERY_CENTER): Payer: 59 | Admitting: Internal Medicine

## 2018-05-15 ENCOUNTER — Encounter: Payer: Self-pay | Admitting: Internal Medicine

## 2018-05-15 VITALS — BP 111/53 | HR 65 | Temp 97.8°F | Resp 19 | Ht 62.0 in | Wt 123.0 lb

## 2018-05-15 DIAGNOSIS — D125 Benign neoplasm of sigmoid colon: Secondary | ICD-10-CM | POA: Diagnosis not present

## 2018-05-15 DIAGNOSIS — Z860100 Personal history of colon polyps, unspecified: Secondary | ICD-10-CM

## 2018-05-15 DIAGNOSIS — D123 Benign neoplasm of transverse colon: Secondary | ICD-10-CM

## 2018-05-15 MED ORDER — SODIUM CHLORIDE 0.9 % IV SOLN: 500.0000 mL | Freq: Once | INTRAVENOUS | Status: DC

## 2018-05-15 NOTE — Progress Notes (Signed)
Pt's states no medical or surgical changes since previsit or office visit. 

## 2018-05-15 NOTE — Progress Notes (Signed)
To recovery, report to RN, VSS. 

## 2018-05-15 NOTE — Patient Instructions (Addendum)
   I found and removed 2 tiny polyps (again) today.  I will let you know pathology results and when to have another routine colonoscopy by mail and/or My Chart.  I appreciate the opportunity to care for you. Gatha Mayer, MD, [facility]   Handout given for polyps.  YOU HAD AN ENDOSCOPIC PROCEDURE TODAY AT [facility]:   Refer to the procedure report that was given to you for any specific questions about what was found during the examination.  If the procedure report does not answer your questions, please call your gastroenterologist to clarify.  If you requested that your care partner not be given the details of your procedure findings, then the procedure report has been included in a sealed envelope for you to review at your convenience later.  YOU SHOULD EXPECT: Some feelings of bloating in the abdomen. Passage of more gas than usual.  Walking can help get rid of the air that was put into your GI tract during the procedure and reduce the bloating. If you had a lower endoscopy (such as a colonoscopy or flexible sigmoidoscopy) you may notice spotting of blood in your stool or on the toilet paper. If you underwent a bowel prep for your procedure, you may not have a normal bowel movement for a few days.  Please Note:  You might notice some irritation and congestion in your nose or some drainage.  This is from the oxygen used during your procedure.  There is no need for concern and it should clear up in a day or so.  SYMPTOMS TO REPORT IMMEDIATELY:   Following lower endoscopy (colonoscopy or flexible sigmoidoscopy):  Excessive amounts of blood in the stool  Significant tenderness or worsening of abdominal pains  Swelling of the abdomen that is new, acute  Fever of 100F or higher   For urgent or emergent issues, a gastroenterologist can be reached at any hour by calling [phone].   DIET:  We do recommend a small meal at first, but then you may proceed to your  regular diet.  Drink plenty of fluids but you should avoid alcoholic beverages for 24 hours.  ACTIVITY:  You should plan to take it easy for the rest of today and you should NOT DRIVE or use heavy machinery until tomorrow (because of the sedation medicines used during the test).    FOLLOW UP: Our staff will call the number listed on your records the next business day following your procedure to check on you and address any questions or concerns that you may have regarding the information given to you following your procedure. If we do not reach you, we will leave a message.  However, if you are feeling well and you are not experiencing any problems, there is no need to return our call.  We will assume that you have returned to your regular daily activities without incident.  If any biopsies were taken you will be contacted by phone or by letter within the next 1-3 weeks.  Please call us at [phone] if you have not heard about the biopsies in 3 weeks.    SIGNATURES/CONFIDENTIALITY: You and/or your care partner have signed paperwork which will be entered into your electronic medical record.  These signatures attest to the fact that that the information above on your After Visit Summary has been reviewed and is understood.  Full responsibility of the confidentiality of this discharge information lies with you and/or your care-partner.

## 2018-05-15 NOTE — Op Note (Signed)
[place] Patient Name: Allison Calderon Procedure Date: 05/15/2018 8:50 AM MRN: [id number] Endoscopist: Gatha Mayer , MD Age: 57 Referring MD:  Date of Birth: 1961-06-24 Gender: Female Account #: [id number] Procedure:                Colonoscopy Indications:              Surveillance: Personal history of adenomatous                            polyps on last colonoscopy 5 years ago Medicines:                Propofol per Anesthesia, Monitored Anesthesia Care Procedure:                Pre-Anesthesia Assessment:                           - Prior to the procedure, a History and Physical                            was performed, and patient medications and                            allergies were reviewed. The patient's tolerance of                            previous anesthesia was also reviewed. The risks                            and benefits of the procedure and the sedation                            options and risks were discussed with the patient.                            All questions were answered, and informed consent                            was obtained. Prior Anticoagulants: The patient has                            taken no previous anticoagulant or antiplatelet                            agents. ASA Grade Assessment: II - A patient with                            mild systemic disease. After reviewing the risks                            and benefits, the patient was deemed in                            satisfactory condition to undergo the procedure.  After obtaining informed consent, the colonoscope                            was passed under direct vision. Throughout the                            procedure, the patient's blood pressure, pulse, and                            oxygen saturations were monitored continuously. The                            Colonoscope was introduced through the anus and   advanced to the the cecum, identified by                            appendiceal orifice and ileocecal valve. The                            patient tolerated the procedure well. The quality                            of the bowel preparation was good. The colonoscopy                            was somewhat difficult due to significant looping.                            Successful completion of the procedure was aided by                            applying abdominal pressure. The bowel preparation                            used was Miralax. The ileocecal valve, appendiceal                            orifice, and rectum were photographed. Scope In: 9:03:37 AM Scope Out: 9:20:54 AM Scope Withdrawal Time: 0 hours 11 minutes 26 seconds  Total Procedure Duration: 0 hours 17 minutes 17 seconds  Findings:                 The perianal and digital rectal examinations were                            normal.                           Two sessile polyps were found in the sigmoid colon                            and transverse colon. The polyps were diminutive in                            size. These polyps  were removed with a cold snare.                            Resection and retrieval were complete. Verification                            of patient identification for the specimen was                            done. Estimated blood loss was minimal.                           The exam was otherwise without abnormality on                            direct and retroflexion views. Complications:            No immediate complications. Estimated Blood Loss:     Estimated blood loss was minimal. Impression:               - Two diminutive polyps in the sigmoid colon and in                            the transverse colon, removed with a cold snare.                            Resected and retrieved.                           - The examination was otherwise normal on direct                            and  retroflexion views. Recommendation:           - Patient has a contact number available for                            emergencies. The signs and symptoms of potential                            delayed complications were discussed with the                            patient. Return to normal activities tomorrow.                            Written discharge instructions were provided to the                            patient.                           - Resume previous diet.                           - Continue present medications.                           -  Repeat colonoscopy is recommended for                            surveillance. The colonoscopy date will be                            determined after pathology results from today's                            exam become available for review. Gatha Mayer, MD 05/15/2018 9:30:54 AM This report has been signed electronically.

## 2018-05-15 NOTE — Progress Notes (Signed)
Called to room to assist during endoscopic procedure.  Patient ID and intended procedure confirmed with present staff. Received instructions for my participation in the procedure from the performing physician.  

## 2018-05-16 ENCOUNTER — Telehealth: Payer: Self-pay | Admitting: *Deleted

## 2018-05-16 NOTE — Telephone Encounter (Signed)
  Follow up Call-  Call back number 05/15/2018  Post procedure Call Back phone  # [phone]  Permission to leave phone message Yes  Some recent data might be hidden     Patient questions:  Do you have a fever, pain , or abdominal swelling? No. Pain Score  0 *  Have you tolerated food without any problems? Yes.    Have you been able to return to your normal activities? Yes.    Do you have any questions about your discharge instructions: Diet   No. Medications  No. Follow up visit  No.  Do you have questions or concerns about your Care? No.  Actions: * If pain score is 4 or above: No action needed, pain <4.

## 2018-05-22 ENCOUNTER — Encounter: Payer: Self-pay | Admitting: Internal Medicine

## 2018-05-22 NOTE — Progress Notes (Signed)
Benign colon mucosa polyps 2 diminutive adenomas So repeat 10 yrs per guideelines My Chart

## 2018-06-13 DIAGNOSIS — L821 Other seborrheic keratosis: Secondary | ICD-10-CM | POA: Diagnosis not present

## 2018-06-13 DIAGNOSIS — L814 Other melanin hyperpigmentation: Secondary | ICD-10-CM | POA: Diagnosis not present

## 2018-06-13 DIAGNOSIS — D225 Melanocytic nevi of trunk: Secondary | ICD-10-CM | POA: Diagnosis not present

## 2018-06-13 DIAGNOSIS — D1801 Hemangioma of skin and subcutaneous tissue: Secondary | ICD-10-CM | POA: Diagnosis not present

## 2018-09-06 ENCOUNTER — Encounter: Payer: Self-pay | Admitting: Family Medicine

## 2018-09-06 DIAGNOSIS — Z23 Encounter for immunization: Secondary | ICD-10-CM | POA: Diagnosis not present

## 2018-11-23 ENCOUNTER — Ambulatory Visit (INDEPENDENT_AMBULATORY_CARE_PROVIDER_SITE_OTHER): Payer: 59 | Admitting: Internal Medicine

## 2018-11-23 ENCOUNTER — Encounter: Payer: Self-pay | Admitting: Internal Medicine

## 2018-11-23 VITALS — BP 116/80 | HR 74 | Ht 62.0 in | Wt 122.5 lb

## 2018-11-23 DIAGNOSIS — K219 Gastro-esophageal reflux disease without esophagitis: Secondary | ICD-10-CM | POA: Diagnosis not present

## 2018-11-23 MED ORDER — FAMOTIDINE 40 MG PO TABS: 40.0000 mg | ORAL_TABLET | Freq: Every day | ORAL | 3 refills | Status: DC

## 2018-11-23 NOTE — Patient Instructions (Signed)
As discussed with Dr. Carlean Purl, purchase a wedge.  We have sent the following medications to your pharmacy for you to pick up at your convenience:  Pepcid at night time  You may take over the counter antacids as needed.  Message [place] with questions or updates.

## 2018-11-23 NOTE — Progress Notes (Signed)
Allison Calderon 57 y.o. [birth year] [id number]  Assessment & Plan:   Encounter Diagnosis  Name Primary?  . Gastroesophageal reflux disease, esophagitis presence not specified Yes    She will try famotidine 40 mg at bedtime.  This may not be covered so she might need to buy over-the-counter.  She will update me through my chart or calling or schedule follow-up, as to how this goes.  We may need to put her back on PPI which she is willing to do.  I have advised her to get a wedge and to try to give more time between eating and going to bed.  Other plans pending clinical course.  She was not inclined to nor do I recommend screening for Barrett's.  I did briefly review the TIF procedure with the patient and how that is an option in the future, potentially if she does not want to take medication.  She would need additional work-up.  I appreciate the opportunity to care for this patient. CC: Girtha Rm, NP-C   Subjective:   Chief Complaint: GERD  HPI Allison Calderon is here because of heartburn and reflux issues, she says 10 years ago she self diagnosed with GERD and started on Nexium prescription strength daily.  She was in very stressful job then and that was clearly a trigger, she was having daily heartburn without the medication she responded to it.  As far as symptoms go the worst are nocturnal regurgitation and reflux, she will have retrosternal burning and pyrosis and sometimes she will have a sore tongue.  Currently her regimen is over-the-counter Prilosec Monday Wednesday Friday and I cimetidine over-the-counter Tuesday Thursday Sunday.  There are some breakthrough symptoms with this.  She does have a brother with Barrett's esophagus and 2 uncles that died of stomach cancer but she is not particularly concerned about screening for Barrett's and is not interested in that.  She would like to get off medicine if she could, she has read the associations of dementia and bone fractures and  kidney failure.  No dysphagia no unintentional weight loss no bleeding or anemia reported.  She does have a 15 pound weight loss attributed to restricted feeding to 8 hours a day and is congratulated on that.  She does eat about 2 hours before bedtime.  She does not have a wedge she does not have the head of bed elevated.  No smoking but has 2 to 3 cups of coffee a day. No Known Allergies Current Meds  Medication Sig  . Calcium Carbonate-Vitamin D (CALTRATE 600+D PO) Take 1 tablet by mouth daily.  . Flaxseed, Linseed, (FLAX SEEDS PO) Take by mouth daily.  Marland Kitchen OVER THE COUNTER MEDICATION Magnesium, 250 mg, one tablet daily.  . [DISCONTINUED] Cimetidine (TAGAMET PO) Take by mouth daily.  . [DISCONTINUED] omeprazole (PRILOSEC) 40 MG capsule Take 40 mg by mouth daily.   Past Medical History:  Diagnosis Date  . Allergy    Patient denies no longer a problem  . BREAST LUMP   . Elevated LDL cholesterol level   . GERD (gastroesophageal reflux disease)   . Heart murmur   . History of chicken pox   . Hx: UTI (urinary tract infection)   . Hyperlipidemia   . Osteoarthritis, knee    R>L  . Personal history of colonic adenomas 03/13/2013   02/2013 - 2 diminutive adenomas   Past Surgical History:  Procedure Laterality Date  . 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   Former smoker, not now, no drugs, 6 beers a week approximately   2 to 3 cups of coffee daily   family history includes Arthritis in her mother; Barrett's esophagus in her brother; Bladder Cancer in her mother; Breast cancer in her maternal aunt; CAD in her father; Cancer in her father; Dementia in her maternal grandmother; Diabetes in her father; Esophageal cancer in her paternal uncle; Gallbladder disease in her father; Glaucoma in her mother; Heart disease in her father; Hyperlipidemia in her brother and father; Hypertension  in her paternal grandfather; Osteoarthritis in her mother; Stroke in her paternal grandfather and paternal grandmother; Valvular heart disease in her father.   Review of Systems See HPI  Objective:   Physical Exam BP 116/80   Calderon 72   Ht 5\' 2"  (1.575 m)   Wt 122 lb 8 oz (55.6 kg)   LMP 02/27/2015   BMI 22.41 kg/m  No acute distress  15 minutes time spent with patient > half in counseling coordination of care

## 2019-01-31 DIAGNOSIS — Z6822 Body mass index (BMI) 22.0-22.9, adult: Secondary | ICD-10-CM | POA: Diagnosis not present

## 2019-01-31 DIAGNOSIS — R69 Illness, unspecified: Secondary | ICD-10-CM | POA: Diagnosis not present

## 2019-01-31 DIAGNOSIS — N952 Postmenopausal atrophic vaginitis: Secondary | ICD-10-CM | POA: Diagnosis not present

## 2019-01-31 DIAGNOSIS — Z01419 Encounter for gynecological examination (general) (routine) without abnormal findings: Secondary | ICD-10-CM | POA: Diagnosis not present

## 2019-02-04 LAB — RESULTS CONSOLE HPV: CHL HPV: NEGATIVE

## 2019-02-04 LAB — HM PAP SMEAR: HM Pap smear: NEGATIVE

## 2019-02-17 NOTE — Patient Instructions (Addendum)
It was a pleasure seeing you today. Continue taking good care of yourself!   Check with your insurance regarding the Shingrix vaccine and it's affordability.  This is a 2 shot series as discussed.  You may also want to check with your insurance regarding a repeat bone density test. Your last one showed osteopenia on 03/22/2017.   You may get your next bone density at the breast center if you decide to do this.   Continue getting adequate calcium in your diet (1,200 mg daily), taking vitamin D 800 or 1,00 IU daily and doing weight bearing exercises.   We will forward your lab results.   Preventive Care 40-64 Years, Female Preventive care refers to lifestyle choices and visits with your health care provider that can promote health and wellness. What does preventive care include?   A yearly physical exam. This is also called an annual well check.  Dental exams once or twice a year.  Routine eye exams. Ask your health care provider how often you should have your eyes checked.  Personal lifestyle choices, including: ? Daily care of your teeth and gums. ? Regular physical activity. ? Eating a healthy diet. ? Avoiding tobacco and drug use. ? Limiting alcohol use. ? Practicing safe sex. ? Taking vitamin and mineral supplements as recommended by your health care provider. What happens during an annual well check? The services and screenings done by your health care provider during your annual well check will depend on your age, overall health, lifestyle risk factors, and family history of disease. Counseling Your health care provider may ask you questions about your:  Alcohol use.  Tobacco use.  Drug use.  Emotional well-being.  Home and relationship well-being.  Sexual activity.  Eating habits.  Work and work Statistician.  Method of birth control.  Menstrual cycle.  Pregnancy history. Screening You may have the following tests or measurements:  Height, weight, and  BMI.  Blood pressure.  Lipid and cholesterol levels. These may be checked every 5 years, or more frequently if you are over 86 years old.  Skin check.  Lung cancer screening. You may have this screening every year starting at age 39 if you have a 30-pack-year history of smoking and currently smoke or have quit within the past 15 years.  Colorectal cancer screening. All adults should have this screening starting at age 92 and continuing until age 49. Your health care provider may recommend screening at age 63. You will have tests every 1-10 years, depending on your results and the type of screening test. People at increased risk should start screening at an earlier age. Screening tests may include: ? Guaiac-based fecal occult blood testing. ? Fecal immunochemical test (FIT). ? Stool DNA test. ? Virtual colonoscopy. ? Sigmoidoscopy. During this test, a flexible tube with a tiny camera (sigmoidoscope) is used to examine your rectum and lower colon. The sigmoidoscope is inserted through your anus into your rectum and lower colon. ? Colonoscopy. During this test, a long, thin, flexible tube with a tiny camera (colonoscope) is used to examine your entire colon and rectum.  Hepatitis C blood test.  Hepatitis B blood test.  Sexually transmitted disease (STD) testing.  Diabetes screening. This is done by checking your blood sugar (glucose) after you have not eaten for a while (fasting). You may have this done every 1-3 years.  Mammogram. This may be done every 1-2 years. Talk to your health care provider about when you should start having regular mammograms. This  may depend on whether you have a family history of breast cancer.  BRCA-related cancer screening. This may be done if you have a family history of breast, ovarian, tubal, or peritoneal cancers.  Pelvic exam and Pap test. This may be done every 3 years starting at age 87. Starting at age 83, this may be done every 5 years if you have a  Pap test in combination with an HPV test.  Bone density scan. This is done to screen for osteoporosis. You may have this scan if you are at high risk for osteoporosis. Discuss your test results, treatment options, and if necessary, the need for more tests with your health care provider. Vaccines Your health care provider may recommend certain vaccines, such as:  Influenza vaccine. This is recommended every year.  Tetanus, diphtheria, and acellular pertussis (Tdap, Td) vaccine. You may need a Td booster every 10 years.  Varicella vaccine. You may need this if you have not been vaccinated.  Shingrix- Zoster vaccine. You may need this after age 37  Measles, mumps, and rubella (MMR) vaccine. You may need at least one dose of MMR if you were born in 1957 or later. You may also need a second dose.  Pneumococcal 13-valent conjugate (PCV13) vaccine. You may need this if you have certain conditions and were not previously vaccinated.  Pneumococcal polysaccharide (PPSV23) vaccine. You may need one or two doses if you smoke cigarettes or if you have certain conditions.  Meningococcal vaccine. You may need this if you have certain conditions.  Hepatitis A vaccine. You may need this if you have certain conditions or if you travel or work in places where you may be exposed to hepatitis A.  Hepatitis B vaccine. You may need this if you have certain conditions or if you travel or work in places where you may be exposed to hepatitis B.  Haemophilus influenzae type b (Hib) vaccine. You may need this if you have certain conditions. Talk to your health care provider about which screenings and vaccines you need and how often you need them. This information is not intended to replace advice given to you by your health care provider. Make sure you discuss any questions you have with your health care provider. Document Released: 01/01/2016 Document Revised: 01/25/2018 Document Reviewed: 10/06/2015 Elsevier  Interactive Patient Education  2019 [employer].

## 2019-02-17 NOTE — Progress Notes (Signed)
Subjective:    Patient ID: Allison Calderon, female    DOB: 11-26-1961, 58 y.o.   MRN: [id number]  HPI Chief Complaint  Patient presents with  . fasting cpe    fasting cpe, no other concerns, gets eyes checked yearly   She is here for a complete physical exam. Concerns today: none  Doing well.   Other providers: Dr. Eastland-Cardiologist Dr. Carlean Purl -GI Marica Otter- optometrist Gustavo Lah - OB/GYN [facility] Dermatology - annually  Osteopenia- last DEXA 03/2017 and she was -2.1 at spine.  Calcium in diet Vitamin D supplement Weight bearing exercise  History of prediabetes with normal Hgb A1c last year Elevated LDL in past with ASCVD 10 yr risk of 2% CT coronary calcium score 0 and evaluation by Dr. Oval Linsey, cardiologist.   Annual dermatologist visit upcoming.   No longer taking PPIs since losing weight  Pepcid 40 mg nightly   Social history: Lives with husband, works as Pension scheme manager  Denies smoking,, drug use and only drinking 1-2 glasses of wine per week.  Diet: intermittent fasting  Excerise: regular walking   Immunizations: Shingrix - has not had this. UTD otherwise   Health maintenance:  Mammogram: 04/16/2018 Colonoscopy: 05/15/2018 and recall in 10 years Last Gynecological Exam: 01/31/2019 and negative   Last Dental Exam: UTD Last Eye Exam: contact lens. Goes annually   Wears seatbelt always, uses sunscreen, smoke detectors in home and functioning, does not text while driving and feels safe in home environment.   Reviewed allergies, medications, past medical, surgical, family, and social history.   Review of Systems Review of Systems Constitutional: -fever, -chills, -sweats, -unexpected weight change,-fatigue ENT: -runny nose, -ear pain, -sore throat Cardiology:  -chest pain, -palpitations, -edema Respiratory: -cough, -shortness of breath, -wheezing Gastroenterology: -abdominal pain, -nausea, -vomiting, -diarrhea, -constipation    Hematology: -bleeding or bruising problems Musculoskeletal: -arthralgias, -myalgias, -joint swelling, -back pain Ophthalmology: -vision changes Urology: -dysuria, -difficulty urinating, -hematuria, -urinary frequency, -urgency Neurology: -headache, -weakness, -tingling, -numbness       Objective:   Physical Exam BP 120/70   Pulse 60   Ht 5\' 2"  (1.575 m)   Wt 122 lb 6.4 oz (55.5 kg)   LMP 02/27/2015   BMI 22.39 kg/m   General Appearance:    Alert, cooperative, no distress, appears stated age  Head:    Normocephalic, without obvious abnormality, atraumatic  Eyes:    PERRL, conjunctiva/corneas clear, EOM's intact, fundi    benign  Ears:    Normal TM's and external ear canals  Nose:   Nares normal, mucosa normal, no drainage or sinus   tenderness  Throat:   Lips, mucosa, and tongue normal; teeth and gums normal  Neck:   Supple, no lymphadenopathy;  thyroid:  no   enlargement/tenderness/nodules; no carotid   bruit or JVD  Back:    Spine nontender, no curvature, ROM normal, no CVA     tenderness  Lungs:     Clear to auscultation bilaterally without wheezes, rales or     ronchi; respirations unlabored  Chest Wall:    No tenderness or deformity   Heart:    Regular rate and rhythm, S1 and S2 normal, no murmur, rub   or gallop  Breast Exam:    OB/GYN   Abdomen:     Soft, non-tender, nondistended, normoactive bowel sounds,    no masses, no hepatosplenomegaly  Genitalia:    OB/GYN      Extremities:   No clubbing, cyanosis or edema  Pulses:  2+ and symmetric all extremities  Skin:   Skin color, texture, turgor normal, no rashes or lesions  Lymph nodes:   Cervical, supraclavicular, and axillary nodes normal  Neurologic:   CNII-XII intact, normal strength, sensation and gait; reflexes 2+ and symmetric throughout          Psych:   Normal mood, affect, hygiene and grooming.    Urinalysis dipstick: negative      Assessment & Plan:  Routine general medical examination at a health care  facility - Plan: CBC with Differential/Platelet, Comprehensive metabolic panel, TSH, T4, free, Lipid panel, POCT Urinalysis DIP (Proadvantage Device)  Osteopenia determined by x-ray - Plan: VITAMIN D 25 Hydroxy (Vit-D Deficiency, Fractures), DG Bone Density  Elevated LDL cholesterol level - Plan: Lipid panel  History of prediabetes - Plan: Hemoglobin A1c  Medication management - Plan: VITAMIN D 25 Hydroxy (Vit-D Deficiency, Fractures)  Estrogen deficiency - Plan: DG Bone Density  She is a pleasant 58 year old female who is here today for her fasting CPE.  She is taking good care of herself.  Intentional weight loss by eating a healthy diet and doing intermittent fasting.  She also exercises. History of osteopenia diagnosed on DEXA in April 2018.  She is getting adequate calcium and vitamin D.  I will check a vitamin D level today.  She is doing weightbearing exercises.  She does have estrogen deficiency. Discussed per screening guidelines that after 2 years it is reasonable to get another bone density for comparison.  She will check with her insurance carrier to make sure this will be covered and the order is in the computer for her.  She will also call and schedule her mammogram when she is due in April. Pap smear is up-to-date.  This was done at [facility] office. History of prediabetes and we will check a hemoglobin A1c to keep an eye on this.  Suspect this to have improved due to healthy diet and exercise. History of hyperlipidemia with ASCVD 10-year risk of 2%.  Recent coronary calcium score of 0.  She is not on a statin. Immunizations reviewed and she has not yet had the Shingrix vaccine.  Encouraged her to contact her insurance carrier to see if this is affordable and if she would like to get that she may call for nurse visit.  Discussed potential side effects. Follow-up pending lab results

## 2019-02-18 ENCOUNTER — Ambulatory Visit (INDEPENDENT_AMBULATORY_CARE_PROVIDER_SITE_OTHER): Payer: 59 | Admitting: Family Medicine

## 2019-02-18 ENCOUNTER — Encounter: Payer: Self-pay | Admitting: Family Medicine

## 2019-02-18 VITALS — BP 120/70 | HR 60 | Ht 62.0 in | Wt 122.4 lb

## 2019-02-18 DIAGNOSIS — Z0000 Encounter for general adult medical examination without abnormal findings: Secondary | ICD-10-CM

## 2019-02-18 DIAGNOSIS — M8580 Other specified disorders of bone density and structure, unspecified site: Secondary | ICD-10-CM | POA: Diagnosis not present

## 2019-02-18 DIAGNOSIS — Z87898 Personal history of other specified conditions: Secondary | ICD-10-CM

## 2019-02-18 DIAGNOSIS — Z79899 Other long term (current) drug therapy: Secondary | ICD-10-CM | POA: Diagnosis not present

## 2019-02-18 DIAGNOSIS — E2839 Other primary ovarian failure: Secondary | ICD-10-CM

## 2019-02-18 DIAGNOSIS — E7800 Pure hypercholesterolemia, unspecified: Secondary | ICD-10-CM | POA: Diagnosis not present

## 2019-02-18 HISTORY — DX: Personal history of other specified conditions: Z87.898

## 2019-02-18 LAB — POCT URINALYSIS DIP (PROADVANTAGE DEVICE)
Bilirubin, UA: NEGATIVE
Blood, UA: NEGATIVE
Glucose, UA: NEGATIVE mg/dL
Ketones, POC UA: NEGATIVE mg/dL
Leukocytes, UA: NEGATIVE
Nitrite, UA: NEGATIVE
Protein Ur, POC: NEGATIVE mg/dL
Specific Gravity, Urine: 1.02
Urobilinogen, Ur: NEGATIVE
pH, UA: 6 (ref 5.0–8.0)

## 2019-02-19 LAB — COMPREHENSIVE METABOLIC PANEL
A/G RATIO: 2 (ref 1.2–2.2)
ALT: 10 IU/L (ref 0–32)
AST: 17 IU/L (ref 0–40)
Albumin: 4.7 g/dL (ref 3.8–4.9)
Alkaline Phosphatase: 75 IU/L (ref 39–117)
BUN / CREAT RATIO: 16 (ref 9–23)
BUN: 16 mg/dL (ref 6–24)
Bilirubin Total: 0.7 mg/dL (ref 0.0–1.2)
CALCIUM: 9.6 mg/dL (ref 8.7–10.2)
CO2: 21 mmol/L (ref 20–29)
CREATININE: 1 mg/dL (ref 0.57–1.00)
Chloride: 105 mmol/L (ref 96–106)
GFR calc Af Amer: 71 mL/min/{1.73_m2} (ref >59)
GFR calc non Af Amer: 62 mL/min/{1.73_m2} (ref >59)
GLUCOSE: 104 mg/dL — AB (ref 65–99)
Globulin, Total: 2.4 g/dL (ref 1.5–4.5)
POTASSIUM: 4.9 mmol/L (ref 3.5–5.2)
SODIUM: 143 mmol/L (ref 134–144)
Total Protein: 7.1 g/dL (ref 6.0–8.5)

## 2019-02-19 LAB — CBC WITH DIFFERENTIAL/PLATELET
BASOS ABS: 0.1 10*3/uL (ref 0.0–0.2)
Basos: 1 %
EOS (ABSOLUTE): 0.1 10*3/uL (ref 0.0–0.4)
EOS: 2 %
Hematocrit: 40.7 % (ref 34.0–46.6)
Hemoglobin: 13.5 g/dL (ref 11.1–15.9)
Immature Grans (Abs): 0 10*3/uL (ref 0.0–0.1)
Immature Granulocytes: 0 %
LYMPHS ABS: 1.7 10*3/uL (ref 0.7–3.1)
Lymphs: 32 %
MCH: 30.3 pg (ref 26.6–33.0)
MCHC: 33.2 g/dL (ref 31.5–35.7)
MCV: 91 fL (ref 79–97)
Monocytes Absolute: 0.4 10*3/uL (ref 0.1–0.9)
Monocytes: 8 %
NEUTROS PCT: 57 %
Neutrophils Absolute: 3.1 10*3/uL (ref 1.4–7.0)
PLATELETS: 223 10*3/uL (ref 150–450)
RBC: 4.46 x10E6/uL (ref 3.77–5.28)
RDW: 12.4 % (ref 11.7–15.4)
WBC: 5.4 10*3/uL (ref 3.4–10.8)

## 2019-02-19 LAB — LIPID PANEL
CHOL/HDL RATIO: 2.9 ratio (ref 0.0–4.4)
Cholesterol, Total: 267 mg/dL — ABNORMAL HIGH (ref 100–199)
HDL: 93 mg/dL (ref >39)
LDL Calculated: 155 mg/dL — ABNORMAL HIGH (ref 0–99)
Triglycerides: 93 mg/dL (ref 0–149)
VLDL CHOLESTEROL CAL: 19 mg/dL (ref 5–40)

## 2019-02-19 LAB — HEMOGLOBIN A1C
Est. average glucose Bld gHb Est-mCnc: 105 mg/dL
Hgb A1c MFr Bld: 5.3 % (ref 4.8–5.6)

## 2019-02-19 LAB — T4, FREE: Free T4: 1.04 ng/dL (ref 0.82–1.77)

## 2019-02-19 LAB — VITAMIN D 25 HYDROXY (VIT D DEFICIENCY, FRACTURES): Vit D, 25-Hydroxy: 31 ng/mL (ref 30.0–100.0)

## 2019-02-19 LAB — TSH: TSH: 4 u[IU]/mL (ref 0.450–4.500)

## 2019-02-20 ENCOUNTER — Telehealth: Payer: Self-pay | Admitting: Family Medicine

## 2019-02-20 NOTE — Telephone Encounter (Signed)
Received requested records from [facility]. Sending back for review.

## 2019-02-28 ENCOUNTER — Encounter: Payer: Self-pay | Admitting: Internal Medicine

## 2019-03-14 ENCOUNTER — Other Ambulatory Visit: Payer: Self-pay | Admitting: Obstetrics and Gynecology

## 2019-03-14 DIAGNOSIS — Z1231 Encounter for screening mammogram for malignant neoplasm of breast: Secondary | ICD-10-CM

## 2019-05-08 ENCOUNTER — Ambulatory Visit: Payer: Self-pay

## 2019-06-18 DIAGNOSIS — L821 Other seborrheic keratosis: Secondary | ICD-10-CM | POA: Diagnosis not present

## 2019-06-18 DIAGNOSIS — L814 Other melanin hyperpigmentation: Secondary | ICD-10-CM | POA: Diagnosis not present

## 2019-06-18 DIAGNOSIS — D225 Melanocytic nevi of trunk: Secondary | ICD-10-CM | POA: Diagnosis not present

## 2019-07-04 ENCOUNTER — Ambulatory Visit
Admission: RE | Admit: 2019-07-04 | Discharge: 2019-07-04 | Disposition: A | Discharge status: Home / Self Care | Payer: 59 | Source: Ambulatory Visit | Attending: Family Medicine | Admitting: Family Medicine

## 2019-07-04 ENCOUNTER — Other Ambulatory Visit: Payer: Self-pay

## 2019-07-04 ENCOUNTER — Ambulatory Visit
Admission: RE | Admit: 2019-07-04 | Discharge: 2019-07-04 | Disposition: A | Discharge status: Home / Self Care | Payer: 59 | Source: Ambulatory Visit | Attending: Obstetrics and Gynecology | Admitting: Obstetrics and Gynecology

## 2019-07-04 DIAGNOSIS — Z780 Asymptomatic menopausal state: Secondary | ICD-10-CM | POA: Diagnosis not present

## 2019-07-04 DIAGNOSIS — E2839 Other primary ovarian failure: Secondary | ICD-10-CM

## 2019-07-04 DIAGNOSIS — M8580 Other specified disorders of bone density and structure, unspecified site: Secondary | ICD-10-CM

## 2019-07-04 DIAGNOSIS — M810 Age-related osteoporosis without current pathological fracture: Secondary | ICD-10-CM | POA: Diagnosis not present

## 2019-07-04 DIAGNOSIS — Z1231 Encounter for screening mammogram for malignant neoplasm of breast: Secondary | ICD-10-CM | POA: Diagnosis not present

## 2019-07-22 ENCOUNTER — Ambulatory Visit (INDEPENDENT_AMBULATORY_CARE_PROVIDER_SITE_OTHER): Payer: 59 | Admitting: Family Medicine

## 2019-07-22 ENCOUNTER — Encounter: Payer: Self-pay | Admitting: Family Medicine

## 2019-07-22 ENCOUNTER — Other Ambulatory Visit: Payer: Self-pay

## 2019-07-22 DIAGNOSIS — M810 Age-related osteoporosis without current pathological fracture: Secondary | ICD-10-CM | POA: Diagnosis not present

## 2019-07-22 HISTORY — DX: Age-related osteoporosis without current pathological fracture: M81.0

## 2019-07-22 NOTE — Progress Notes (Signed)
   Subjective:   Documentation for virtual audio and video telecommunications through [place] encounter:  The patient was located in her car. 2 patient identifiers used.  The provider was located in the office. The patient did consent to this visit and is aware of possible charges through their insurance for this visit.  The other persons participating in this telemedicine service were none.    Patient ID: Allison Calderon, female    DOB: 1961-07-19, 58 y.o.   MRN: [id number]  HPI Chief Complaint  Patient presents with  . Follow-up    bone density    Discussion regarding bone density results. She had significant decrease in BMD of her lumbar spine and total mean hip since 2018.Marland Kitchen Now T-score of -2.6 in her spine from April 2018 to July 2020. Estrogen deficient.  Family history of osteoporosis in mother and aunts.   States she took Fosamax for 12 years in the past. Stopped in 2007. Did not have any issues with Fosamax.  Takes Pepcid due to GERD. Has taken PPIs in the past.   Taking calcium, vitamin D and exercises regularly.  No fracture.   Denies fever, chills, N/V/D.   Reviewed allergies, medications, past medical, surgical, family, and social history.    Review of Systems Pertinent positives and negatives in the history of present illness.     Objective:   Physical Exam Temp 98.2 F (36.8 C) (Oral)   Ht 5\' 2"  (1.575 m)   Wt 120 lb (54.4 kg)   LMP 02/27/2015   BMI 21.95 kg/m   Alert and oriented and in no acute distress. Respirations unlabored. Normal speech, mood and thought process. Unable to examine otherwise.        Assessment & Plan:  Age-related osteoporosis without current pathological fracture - Plan: continue with calcium, vitamin D and weight bearing exercises. Discussed treatment options including Prolia or Fosamax. She would like to do some research and let me know. Will follow up when ready. Is considering having dental work done before starting  treatment.   Time spent on call was 15 minutes and in review of previous records 2 minutes total.  This virtual service is not related to other E/M service within previous 7 days.

## 2019-08-02 ENCOUNTER — Encounter: Payer: Self-pay | Admitting: Family Medicine

## 2019-08-27 ENCOUNTER — Other Ambulatory Visit (INDEPENDENT_AMBULATORY_CARE_PROVIDER_SITE_OTHER): Payer: 59

## 2019-08-27 ENCOUNTER — Other Ambulatory Visit: Payer: Self-pay

## 2019-08-27 DIAGNOSIS — Z23 Encounter for immunization: Secondary | ICD-10-CM | POA: Diagnosis not present

## 2019-09-16 ENCOUNTER — Telehealth: Payer: Self-pay | Admitting: Family Medicine

## 2019-09-16 NOTE — Telephone Encounter (Signed)
Left message for pt to call concerning prolia 

## 2019-10-09 ENCOUNTER — Other Ambulatory Visit: Payer: Self-pay | Admitting: Family Medicine

## 2019-10-09 ENCOUNTER — Encounter: Payer: Self-pay | Admitting: Family Medicine

## 2019-10-09 DIAGNOSIS — M810 Age-related osteoporosis without current pathological fracture: Secondary | ICD-10-CM

## 2019-10-09 MED ORDER — ALENDRONATE SODIUM 70 MG PO TABS: 70.0000 mg | ORAL_TABLET | ORAL | 11 refills | Status: DC

## 2019-12-03 ENCOUNTER — Other Ambulatory Visit: Payer: Self-pay

## 2019-12-03 MED ORDER — FAMOTIDINE 40 MG PO TABS: 40.0000 mg | ORAL_TABLET | Freq: Every day | ORAL | 3 refills | Status: DC

## 2019-12-03 NOTE — Telephone Encounter (Signed)
Patients famotidine refilled as pharmacy requested.

## 2020-02-18 ENCOUNTER — Other Ambulatory Visit: Payer: Self-pay | Admitting: Obstetrics and Gynecology

## 2020-02-18 DIAGNOSIS — Z1231 Encounter for screening mammogram for malignant neoplasm of breast: Secondary | ICD-10-CM

## 2020-02-23 NOTE — Patient Instructions (Addendum)
It was a pleasure seeing you today.   Keep up the good work with healthy diet and exercise.   Continue on your current medications and we will be in touch with your lab results.     Preventive Care 61-61 Years Old, Female Preventive care refers to visits with your health care provider and lifestyle choices that can promote health and wellness. This includes:  A yearly physical exam. This may also be called an annual well check.  Regular dental visits and eye exams.  Immunizations.  Screening for certain conditions.  Healthy lifestyle choices, such as eating a healthy diet, getting regular exercise, not using drugs or products that contain nicotine and tobacco, and limiting alcohol use. What can I expect for my preventive care visit? Physical exam Your health care provider will check your:  Height and weight. This may be used to calculate body mass index (BMI), which tells if you are at a healthy weight.  Heart rate and blood pressure.  Skin for abnormal spots. Counseling Your health care provider may ask you questions about your:  Alcohol, tobacco, and drug use.  Emotional well-being.  Home and relationship well-being.  Sexual activity.  Eating habits.  Work and work Statistician.  Method of birth control.  Menstrual cycle.  Pregnancy history. What immunizations do I need?  Influenza (flu) vaccine  This is recommended every year. Tetanus, diphtheria, and pertussis (Tdap) vaccine  You may need a Td booster every 10 years. Varicella (chickenpox) vaccine  You may need this if you have not been vaccinated. Zoster (shingles) vaccine  You may need this after age 68. Measles, mumps, and rubella (MMR) vaccine  You may need at least one dose of MMR if you were born in 1957 or later. You may also need a second dose. Pneumococcal conjugate (PCV13) vaccine  You may need this if you have certain conditions and were not previously vaccinated. Pneumococcal  polysaccharide (PPSV23) vaccine  You may need one or two doses if you smoke cigarettes or if you have certain conditions. Meningococcal conjugate (MenACWY) vaccine  You may need this if you have certain conditions. Hepatitis A vaccine  You may need this if you have certain conditions or if you travel or work in places where you may be exposed to hepatitis A. Hepatitis B vaccine  You may need this if you have certain conditions or if you travel or work in places where you may be exposed to hepatitis B. Haemophilus influenzae type b (Hib) vaccine  You may need this if you have certain conditions. Human papillomavirus (HPV) vaccine  If recommended by your health care provider, you may need three doses over 6 months. You may receive vaccines as individual doses or as more than one vaccine together in one shot (combination vaccines). Talk with your health care provider about the risks and benefits of combination vaccines. What tests do I need? Blood tests  Lipid and cholesterol levels. These may be checked every 5 years, or more frequently if you are over 52 years old.  Hepatitis C test.  Hepatitis B test. Screening  Lung cancer screening. You may have this screening every year starting at age 44 if you have a 30-pack-year history of smoking and currently smoke or have quit within the past 15 years.  Colorectal cancer screening. All adults should have this screening starting at age 7 and continuing until age 68. Your health care provider may recommend screening at age 16 if you are at increased risk. You will  have tests every 1-10 years, depending on your results and the type of screening test.  Diabetes screening. This is done by checking your blood sugar (glucose) after you have not eaten for a while (fasting). You may have this done every 1-3 years.  Mammogram. This may be done every 1-2 years. Talk with your health care provider about when you should start having regular  mammograms. This may depend on whether you have a family history of breast cancer.  BRCA-related cancer screening. This may be done if you have a family history of breast, ovarian, tubal, or peritoneal cancers.  Pelvic exam and Pap test. This may be done every 3 years starting at age 64. Starting at age 24, this may be done every 5 years if you have a Pap test in combination with an HPV test. Other tests  Sexually transmitted disease (STD) testing.  Bone density scan. This is done to screen for osteoporosis. You may have this scan if you are at high risk for osteoporosis. Follow these instructions at home: Eating and drinking  Eat a diet that includes fresh fruits and vegetables, whole grains, lean protein, and low-fat dairy.  Take vitamin and mineral supplements as recommended by your health care provider.  Do not drink alcohol if: ? Your health care provider tells you not to drink. ? You are pregnant, may be pregnant, or are planning to become pregnant.  If you drink alcohol: ? Limit how much you have to 0-1 drink a day. ? Be aware of how much alcohol is in your drink. In the [place] one drink equals one 12 oz bottle of beer (355 mL), one 5 oz glass of wine (148 mL), or one 1 oz glass of hard liquor (44 mL). Lifestyle  Take daily care of your teeth and gums.  Stay active. Exercise for at least 30 minutes on 5 or more days each week.  Do not use any products that contain nicotine or tobacco, such as cigarettes, e-cigarettes, and chewing tobacco. If you need help quitting, ask your health care provider.  If you are sexually active, practice safe sex. Use a condom or other form of birth control (contraception) in order to prevent pregnancy and STIs (sexually transmitted infections).  If told by your health care provider, take low-dose aspirin daily starting at age 74. What's next?  Visit your health care provider once a year for a well check visit.  Ask your health care provider  how often you should have your eyes and teeth checked.  Stay up to date on all vaccines. This information is not intended to replace advice given to you by your health care provider. Make sure you discuss any questions you have with your health care provider. Document Revised: 08/16/2018 Document Reviewed: 08/16/2018 Elsevier Patient Education  2020 [employer].

## 2020-02-23 NOTE — Progress Notes (Signed)
Subjective:    Patient ID: Allison Calderon, female    DOB: 05/30/1961, 59 y.o.   MRN: [id number]  HPI Chief Complaint  Patient presents with  . fasting cpe    fasitng cpe, sees obgyn and no concerns   She is here for a complete physical exam.   Other providers: Dr. Carlean Purl- GI Dr. Oval Linsey - cardiologist Marica Otter- optometrist Gustavo Lah - OB/GYN [facility] Dermatology Dr. Renda Rolls- annually Bess Harvest- dentist   PMH: Osteoporosis- spine -2.6  Taking alendronate weekly without any issues.  Taking vitamin D supplement daily.  Also taking a calcium supplement and does eat/drink dairy  Adequate weight bearing exercise.   GERD- taking daily Pepcid. Having some flare ups but manageable.   Social history: Lives with husband, works for [employer]  Denies smoking and drug use. Drinks red wine 1 glass most days.  Diet: healthy and well balanced Excerise: walks daily and strength training   Immunizations: UTD including Covid vaccines   Health maintenance:  Mammogram: scheduled for 07/07/2020 Colonoscopy: 05/15/2018 Last Gynecological Exam: 02/04/19 Last Dental Exam: 11/2019 Last Eye Exam: due now   Wears seatbelt always, uses sunscreen, smoke detectors in home and functioning, does not text while driving and feels safe in home environment.   Reviewed allergies, medications, past medical, surgical, family, and social history.    Review of Systems Review of Systems Constitutional: -fever, -chills, -sweats, -unexpected weight change,-fatigue ENT: -runny nose, -ear pain, -sore throat Cardiology:  -chest pain, -palpitations, -edema Respiratory: -cough, -shortness of breath, -wheezing Gastroenterology: -abdominal pain, -nausea, -vomiting, -diarrhea, -constipation  Hematology: -bleeding or bruising problems Musculoskeletal: -arthralgias, -myalgias, -joint swelling, -back pain Ophthalmology: -vision changes Urology: -dysuria, -difficulty urinating, -hematuria,  -urinary frequency, -urgency Neurology: -headache, -weakness, -tingling, -numbness       Objective:   Physical Exam BP 124/80   Pulse 68   Temp (!) 97.3 F (36.3 C)   Ht 5' 3.5" (1.613 m)   Wt 128 lb 12.8 oz (58.4 kg)   LMP 02/27/2015   BMI 22.46 kg/m   General Appearance:    Alert, cooperative, no distress, appears stated age  Head:    Normocephalic, without obvious abnormality, atraumatic  Eyes:    PERRL, conjunctiva/corneas clear, EOM's intact  Ears:    Normal TM's and external ear canals  Nose:   Mask in place   Throat:   Mask in place   Neck:   Supple, no lymphadenopathy;  thyroid:  no   enlargement/tenderness/nodules; no JVD  Back:    Spine nontender, no curvature, ROM normal, no CVA     tenderness  Lungs:     Clear to auscultation bilaterally without wheezes, rales or     ronchi; respirations unlabored  Chest Wall:    No tenderness or deformity   Heart:    Regular rate and rhythm, S1 and S2 normal, no murmur, rub   or gallop  Breast Exam:    No tenderness, masses, or nipple discharge or inversion.      No axillary lymphadenopathy  Abdomen:     Soft, non-tender, nondistended, normoactive bowel sounds,    no masses, no hepatosplenomegaly  Genitalia:    OB/GYN     Extremities:   No clubbing, cyanosis or edema  Pulses:   2+ and symmetric all extremities  Skin:   Skin color, texture, turgor normal, no rashes or lesions  Lymph nodes:   Cervical and supraclavicular nodes normal  Neurologic:   CNII-XII intact, normal strength, sensation and gait  Psych:   Normal mood, affect, hygiene and grooming.        Assessment & Plan:  Routine general medical examination at a health care facility - Plan: CBC with Differential/Platelet, Comprehensive metabolic panel, TSH, T4, free, Lipid panel  Age-related osteoporosis without current pathological fracture - Plan: VITAMIN D 25 Hydroxy (Vit-D Deficiency, Fractures)  Screening for thyroid disorder - Plan: TSH, T4,  free  History of prediabetes - Plan: Hemoglobin A1c  Medication management - Plan: CBC with Differential/Platelet, Comprehensive metabolic panel, VITAMIN D 25 Hydroxy (Vit-D Deficiency, Fractures)  History of vitamin D deficiency - Plan: VITAMIN D 25 Hydroxy (Vit-D Deficiency, Fractures)  She is here for a fasting CPE.  No new concerns today. In her usual state of health and in good spirits. Reviewed preventive healthcare and she is up-to-date.  She does have an OB/GYN and up-to-date on mammogram and Pap smear.  Dental and eye exams regularly. She sees dermatology annually. Congratulated her on taking good care of herself including healthy diet and regular exercise. She is taking a vitamin D supplement daily and we will need to check her vitamin D level. Osteoporosis-is taking weekly alendronate without any issues.  Continue with adequate calcium, vitamin D supplement and weightbearing exercise. Follow-up pending results and in 1 year

## 2020-02-24 ENCOUNTER — Encounter: Payer: Self-pay | Admitting: Family Medicine

## 2020-02-24 ENCOUNTER — Other Ambulatory Visit: Payer: Self-pay

## 2020-02-24 ENCOUNTER — Ambulatory Visit (INDEPENDENT_AMBULATORY_CARE_PROVIDER_SITE_OTHER): Payer: 59 | Admitting: Family Medicine

## 2020-02-24 VITALS — BP 124/80 | HR 68 | Temp 97.3°F | Ht 63.5 in | Wt 128.8 lb

## 2020-02-24 DIAGNOSIS — Z8639 Personal history of other endocrine, nutritional and metabolic disease: Secondary | ICD-10-CM

## 2020-02-24 DIAGNOSIS — Z1329 Encounter for screening for other suspected endocrine disorder: Secondary | ICD-10-CM

## 2020-02-24 DIAGNOSIS — Z0000 Encounter for general adult medical examination without abnormal findings: Secondary | ICD-10-CM

## 2020-02-24 DIAGNOSIS — M810 Age-related osteoporosis without current pathological fracture: Secondary | ICD-10-CM | POA: Diagnosis not present

## 2020-02-24 DIAGNOSIS — Z79899 Other long term (current) drug therapy: Secondary | ICD-10-CM | POA: Diagnosis not present

## 2020-02-24 DIAGNOSIS — Z87898 Personal history of other specified conditions: Secondary | ICD-10-CM

## 2020-02-25 ENCOUNTER — Encounter: Payer: Self-pay | Admitting: Family Medicine

## 2020-02-25 ENCOUNTER — Other Ambulatory Visit: Payer: Self-pay | Admitting: Family Medicine

## 2020-02-25 DIAGNOSIS — R7989 Other specified abnormal findings of blood chemistry: Secondary | ICD-10-CM

## 2020-02-25 HISTORY — DX: Other specified abnormal findings of blood chemistry: R79.89

## 2020-02-25 LAB — HEMOGLOBIN A1C
Est. average glucose Bld gHb Est-mCnc: 105 mg/dL
Hgb A1c MFr Bld: 5.3 % (ref 4.8–5.6)

## 2020-02-25 LAB — COMPREHENSIVE METABOLIC PANEL
ALT: 15 IU/L (ref 0–32)
AST: 25 IU/L (ref 0–40)
Albumin/Globulin Ratio: 2 (ref 1.2–2.2)
Albumin: 4.6 g/dL (ref 3.8–4.9)
Alkaline Phosphatase: 68 IU/L (ref 39–117)
BUN/Creatinine Ratio: 14 (ref 12–28)
BUN: 16 mg/dL (ref 8–27)
Bilirubin Total: 0.6 mg/dL (ref 0.0–1.2)
CO2: 24 mmol/L (ref 20–29)
Calcium: 9.9 mg/dL (ref 8.7–10.3)
Chloride: 104 mmol/L (ref 96–106)
Creatinine, Ser: 1.11 mg/dL — ABNORMAL HIGH (ref 0.57–1.00)
GFR calc Af Amer: 62 mL/min/{1.73_m2} (ref >59)
GFR calc non Af Amer: 54 mL/min/{1.73_m2} — ABNORMAL LOW (ref >59)
Globulin, Total: 2.3 g/dL (ref 1.5–4.5)
Glucose: 95 mg/dL (ref 65–99)
Potassium: 5 mmol/L (ref 3.5–5.2)
Sodium: 141 mmol/L (ref 134–144)
Total Protein: 6.9 g/dL (ref 6.0–8.5)

## 2020-02-25 LAB — LIPID PANEL
Chol/HDL Ratio: 2.5 ratio (ref 0.0–4.4)
Cholesterol, Total: 236 mg/dL — ABNORMAL HIGH (ref 100–199)
HDL: 94 mg/dL (ref >39)
LDL Chol Calc (NIH): 132 mg/dL — ABNORMAL HIGH (ref 0–99)
Triglycerides: 62 mg/dL (ref 0–149)
VLDL Cholesterol Cal: 10 mg/dL (ref 5–40)

## 2020-02-25 LAB — CBC WITH DIFFERENTIAL/PLATELET
Basophils Absolute: 0.1 10*3/uL (ref 0.0–0.2)
Basos: 2 %
EOS (ABSOLUTE): 0.1 10*3/uL (ref 0.0–0.4)
Eos: 3 %
Hematocrit: 38.7 % (ref 34.0–46.6)
Hemoglobin: 13.5 g/dL (ref 11.1–15.9)
Immature Grans (Abs): 0 10*3/uL (ref 0.0–0.1)
Immature Granulocytes: 0 %
Lymphocytes Absolute: 1.7 10*3/uL (ref 0.7–3.1)
Lymphs: 31 %
MCH: 31.3 pg (ref 26.6–33.0)
MCHC: 34.9 g/dL (ref 31.5–35.7)
MCV: 90 fL (ref 79–97)
Monocytes Absolute: 0.5 10*3/uL (ref 0.1–0.9)
Monocytes: 10 %
Neutrophils Absolute: 3 10*3/uL (ref 1.4–7.0)
Neutrophils: 54 %
Platelets: 221 10*3/uL (ref 150–450)
RBC: 4.31 x10E6/uL (ref 3.77–5.28)
RDW: 12.4 % (ref 11.7–15.4)
WBC: 5.4 10*3/uL (ref 3.4–10.8)

## 2020-02-25 LAB — VITAMIN D 25 HYDROXY (VIT D DEFICIENCY, FRACTURES): Vit D, 25-Hydroxy: 30.2 ng/mL (ref 30.0–100.0)

## 2020-02-25 LAB — TSH: TSH: 3.69 u[IU]/mL (ref 0.450–4.500)

## 2020-02-25 LAB — T4, FREE: Free T4: 0.93 ng/dL (ref 0.82–1.77)

## 2020-05-20 DIAGNOSIS — Z01419 Encounter for gynecological examination (general) (routine) without abnormal findings: Secondary | ICD-10-CM | POA: Diagnosis not present

## 2020-05-20 DIAGNOSIS — Z6823 Body mass index (BMI) 23.0-23.9, adult: Secondary | ICD-10-CM | POA: Diagnosis not present

## 2020-06-29 DIAGNOSIS — L570 Actinic keratosis: Secondary | ICD-10-CM | POA: Diagnosis not present

## 2020-06-29 DIAGNOSIS — L918 Other hypertrophic disorders of the skin: Secondary | ICD-10-CM | POA: Diagnosis not present

## 2020-06-29 DIAGNOSIS — L578 Other skin changes due to chronic exposure to nonionizing radiation: Secondary | ICD-10-CM | POA: Diagnosis not present

## 2020-06-29 DIAGNOSIS — L821 Other seborrheic keratosis: Secondary | ICD-10-CM | POA: Diagnosis not present

## 2020-06-29 DIAGNOSIS — D225 Melanocytic nevi of trunk: Secondary | ICD-10-CM | POA: Diagnosis not present

## 2020-06-29 DIAGNOSIS — L814 Other melanin hyperpigmentation: Secondary | ICD-10-CM | POA: Diagnosis not present

## 2020-07-07 ENCOUNTER — Ambulatory Visit
Admission: RE | Admit: 2020-07-07 | Discharge: 2020-07-07 | Disposition: A | Discharge status: Home / Self Care | Payer: 59 | Source: Ambulatory Visit | Attending: Obstetrics and Gynecology | Admitting: Obstetrics and Gynecology

## 2020-07-07 ENCOUNTER — Other Ambulatory Visit: Payer: Self-pay

## 2020-07-07 DIAGNOSIS — Z1231 Encounter for screening mammogram for malignant neoplasm of breast: Secondary | ICD-10-CM

## 2020-07-09 ENCOUNTER — Encounter: Payer: Self-pay | Admitting: Internal Medicine

## 2020-07-17 ENCOUNTER — Other Ambulatory Visit: Payer: 59

## 2020-07-17 ENCOUNTER — Other Ambulatory Visit: Payer: Self-pay

## 2020-07-17 DIAGNOSIS — R7989 Other specified abnormal findings of blood chemistry: Secondary | ICD-10-CM

## 2020-07-17 LAB — BASIC METABOLIC PANEL
BUN/Creatinine Ratio: 11 — ABNORMAL LOW (ref 12–28)
BUN: 12 mg/dL (ref 8–27)
CO2: 19 mmol/L — ABNORMAL LOW (ref 20–29)
Calcium: 9.3 mg/dL (ref 8.7–10.3)
Chloride: 105 mmol/L (ref 96–106)
Creatinine, Ser: 1.07 mg/dL — ABNORMAL HIGH (ref 0.57–1.00)
GFR calc Af Amer: 65 mL/min/{1.73_m2} (ref >59)
GFR calc non Af Amer: 57 mL/min/{1.73_m2} — ABNORMAL LOW (ref >59)
Glucose: 96 mg/dL (ref 65–99)
Potassium: 4.8 mmol/L (ref 3.5–5.2)
Sodium: 140 mmol/L (ref 134–144)

## 2020-07-18 ENCOUNTER — Encounter: Payer: Self-pay | Admitting: Family Medicine

## 2020-09-08 ENCOUNTER — Other Ambulatory Visit: Payer: Self-pay | Admitting: Family Medicine

## 2020-09-08 DIAGNOSIS — M810 Age-related osteoporosis without current pathological fracture: Secondary | ICD-10-CM

## 2020-09-08 NOTE — Telephone Encounter (Signed)
Had cpe in march 2022 scheduled

## 2020-10-07 ENCOUNTER — Other Ambulatory Visit: Payer: Self-pay

## 2020-10-07 ENCOUNTER — Other Ambulatory Visit (INDEPENDENT_AMBULATORY_CARE_PROVIDER_SITE_OTHER): Payer: 59

## 2020-10-07 DIAGNOSIS — Z23 Encounter for immunization: Secondary | ICD-10-CM | POA: Diagnosis not present

## 2020-12-29 ENCOUNTER — Other Ambulatory Visit: Payer: Self-pay | Admitting: Internal Medicine

## 2021-01-22 ENCOUNTER — Other Ambulatory Visit: Payer: Self-pay | Admitting: Internal Medicine

## 2021-02-16 ENCOUNTER — Other Ambulatory Visit: Payer: Self-pay | Admitting: Internal Medicine

## 2021-02-28 NOTE — Progress Notes (Signed)
Subjective:    Patient ID: Allison Calderon, female    DOB: 20-Sep-1961, 60 y.o.   MRN: [id number]  HPI Chief Complaint  Patient presents with  . fasting cpe    Fasting cpe, has obgyn but needs a new one. Right knee pain on and off, no other concerns   She is here for a complete physical exam.   Other providers: Dr. Carlean Purl- GI Dr. Oval Linsey - cardiologist Marica Otter- optometrist Gustavo Lah - OB/GYN [facility] Dermatology Dr. Renda Rolls- annually Bess Harvest- dentist    Complains of intermittent right knee pain for several months, worsening. Pain is medially and laterally. Reports pain at rest and while in bed as of late. No known injury. No locking, popping or giving away.  She has tried Mobic and this did help. Upcoming trip which will require a lot of walking.   Increased pressure in her eyes on exam recently   Osteoporosis- spine -2.6  Taking alendronate weekly without any issues.  Taking vitamin D supplement daily.  Also taking a calcium supplement and does eat/drink dairy  Adequate weight bearing exercise.   Social history: Lives with husband, works for [employer]  Denies smoking and drug use. Drinks red wine only on weekend now.  Diet: healthy and well balanced Excerise: walks daily and strength training   Immunizations: UTD.   Health maintenance:  Mammogram:  07/07/2020 DEXA: July 2020  Colonoscopy: 04/2018 Last Gynecological Exam: Last year  Last Dental Exam: 12/2020 Last Eye Exam: due in spring   Wears seatbelt always, uses sunscreen, smoke detectors in home and functioning, does not text while driving and feels safe in home environment.   Reviewed allergies, medications, past medical, surgical, family, and social history.    Review of Systems Review of Systems Constitutional: -fever, -chills, -sweats, -unexpected weight change,-fatigue ENT: -runny nose, -ear pain, -sore throat Cardiology:  -chest pain, -palpitations, -edema Respiratory:  -cough, -shortness of breath, -wheezing Gastroenterology: -abdominal pain, -nausea, -vomiting, -diarrhea, -constipation  Hematology: -bleeding or bruising problems Musculoskeletal: +arthralgias, -myalgias, -joint swelling, -back pain Ophthalmology: -vision changes Urology: -dysuria, -difficulty urinating, -hematuria, -urinary frequency, -urgency Neurology: -headache, -weakness, -tingling, -numbness       Objective:   Physical Exam BP 120/80   Pulse 68   Ht 5' 2.25" (1.581 m)   Wt 129 lb (58.5 kg)   LMP 02/27/2015   BMI 23.41 kg/m   General Appearance:    Alert, cooperative, no distress, appears stated age  Head:    Normocephalic, without obvious abnormality, atraumatic  Eyes:    PERRL, conjunctiva/corneas clear, EOM's intact  Ears:    Normal TM's and external ear canals  Nose:   Mask on   Throat:   Mask on   Neck:   Supple, no lymphadenopathy;  thyroid:  no   enlargement/tenderness/nodules; no JVD  Back:    Spine nontender, no curvature, ROM normal, no CVA     tenderness  Lungs:     Clear to auscultation bilaterally without wheezes, rales or     ronchi; respirations unlabored  Chest Wall:    No tenderness or deformity   Heart:    Regular rate and rhythm, S1 and S2 normal, no murmur, rub   or gallop  Breast Exam:    OB/GYN  Abdomen:     Soft, non-tender, nondistended, normoactive bowel sounds,    no masses, no hepatosplenomegaly  Genitalia:    OB/GYN     Extremities:   No clubbing, cyanosis or edema. Right knee with TTP  to medial and lateral joint lines. No erythema, effusion. Negative McMurrays. RLE is neurovascularly intact.   Pulses:   2+ and symmetric all extremities  Skin:   Skin color, texture, turgor normal, no rashes or lesions  Lymph nodes:   Cervical, supraclavicular, and axillary nodes normal  Neurologic:   CNII-XII intact, normal strength, sensation and gait.           Psych:   Normal mood, affect, hygiene and grooming.        Assessment & Plan:  Routine  general medical examination at a health care facility - Plan: CBC with Differential/Platelet, Comprehensive metabolic panel, TSH, T4, free -preventive health care reviewed. Followed by OB/GYN and I will request recent mammogram and pap smear. UTD with screenings. Recommend regular dental, eye exams and skin checks. Continue healthy diet and getting adequate exercise. Immunizations reviewed. May return for Shingrix. Counseling done.   Chronic pain of right knee - Plan: meloxicam (MOBIC) 7.5 MG tablet -try Mobic and Voltaren gel prn. Sample of voltaren provided. Use ice prn pain. Elevate when possible. Offer referral to ortho or XR and will hold off for now.   Age-related osteoporosis without current pathological fracture -continue alendronate,  vitamin D, calcium in diet and weight bearing exercise. May repeat DEXA in July 2022  Elevated LDL cholesterol level - Plan: Lipid panel -continue healthy, low fat diet and exercise. 10 year ASCVD 2.0% in 2021.   Screening for thyroid disorder - Plan: TSH, T4, free

## 2021-02-28 NOTE — Patient Instructions (Addendum)
Let me know if you would like to return for a Shingrix vaccine. This is a 2 shot series.   Try the Mobic for your knee. Good quad exercises may help. Use ice and elevation for knee pain.   Let me know if you want a referral to gynecology or orthopedist.   I will be in touch with your lab results.    Preventive Care 34-62 Years Old, Female Preventive care refers to lifestyle choices and visits with your health care provider that can promote health and wellness. This includes:  A yearly physical exam. This is also called an annual wellness visit.  Regular dental and eye exams.  Immunizations.  Screening for certain conditions.  Healthy lifestyle choices, such as: ? Eating a healthy diet. ? Getting regular exercise. ? Not using drugs or products that contain nicotine and tobacco. ? Limiting alcohol use. What can I expect for my preventive care visit? Physical exam Your health care provider will check your:  Height and weight. These may be used to calculate your BMI (body mass index). BMI is a measurement that tells if you are at a healthy weight.  Heart rate and blood pressure.  Body temperature.  Skin for abnormal spots. Counseling Your health care provider may ask you questions about your:  Past medical problems.  Family's medical history.  Alcohol, tobacco, and drug use.  Emotional well-being.  Home life and relationship well-being.  Sexual activity.  Diet, exercise, and sleep habits.  Work and work Statistician.  Access to firearms.  Method of birth control.  Menstrual cycle.  Pregnancy history. What immunizations do I need? Vaccines are usually given at various ages, according to a schedule. Your health care provider will recommend vaccines for you based on your age, medical history, and lifestyle or other factors, such as travel or where you work.   What tests do I need? Blood tests  Lipid and cholesterol levels. These may be checked every 5 years,  or more often if you are over 79 years old.  Hepatitis C test.  Hepatitis B test. Screening  Lung cancer screening. You may have this screening every year starting at age 16 if you have a 30-pack-year history of smoking and currently smoke or have quit within the past 15 years.  Colorectal cancer screening. ? All adults should have this screening starting at age 62 and continuing until age 7. ? Your health care provider may recommend screening at age 84 if you are at increased risk. ? You will have tests every 1-10 years, depending on your results and the type of screening test.  Diabetes screening. ? This is done by checking your blood sugar (glucose) after you have not eaten for a while (fasting). ? You may have this done every 1-3 years.  Mammogram. ? This may be done every 1-2 years. ? Talk with your health care provider about when you should start having regular mammograms. This may depend on whether you have a family history of breast cancer.  BRCA-related cancer screening. This may be done if you have a family history of breast, ovarian, tubal, or peritoneal cancers.  Pelvic exam and Pap test. ? This may be done every 3 years starting at age 49. ? Starting at age 61, this may be done every 5 years if you have a Pap test in combination with an HPV test. Other tests  STD (sexually transmitted disease) testing, if you are at risk.  Bone density scan. This is done to screen  for osteoporosis. You may have this scan if you are at high risk for osteoporosis. Talk with your health care provider about your test results, treatment options, and if necessary, the need for more tests. Follow these instructions at home: Eating and drinking  Eat a diet that includes fresh fruits and vegetables, whole grains, lean protein, and low-fat dairy products.  Take vitamin and mineral supplements as recommended by your health care provider.  Do not drink alcohol if: ? Your health care  provider tells you not to drink. ? You are pregnant, may be pregnant, or are planning to become pregnant.  If you drink alcohol: ? Limit how much you have to 0-1 drink a day. ? Be aware of how much alcohol is in your drink. In the [place] one drink equals one 12 oz bottle of beer (355 mL), one 5 oz glass of wine (148 mL), or one 1 oz glass of hard liquor (44 mL).   Lifestyle  Take daily care of your teeth and gums. Brush your teeth every morning and night with fluoride toothpaste. Floss one time each day.  Stay active. Exercise for at least 30 minutes 5 or more days each week.  Do not use any products that contain nicotine or tobacco, such as cigarettes, e-cigarettes, and chewing tobacco. If you need help quitting, ask your health care provider.  Do not use drugs.  If you are sexually active, practice safe sex. Use a condom or other form of protection to prevent STIs (sexually transmitted infections).  If you do not wish to become pregnant, use a form of birth control. If you plan to become pregnant, see your health care provider for a prepregnancy visit.  If told by your health care provider, take low-dose aspirin daily starting at age 20.  Find healthy ways to cope with stress, such as: ? Meditation, yoga, or listening to music. ? Journaling. ? Talking to a trusted person. ? Spending time with friends and family. Safety  Always wear your seat belt while driving or riding in a vehicle.  Do not drive: ? If you have been drinking alcohol. Do not ride with someone who has been drinking. ? When you are tired or distracted. ? While texting.  Wear a helmet and other protective equipment during sports activities.  If you have firearms in your house, make sure you follow all gun safety procedures. What's next?  Visit your health care provider once a year for an annual wellness visit.  Ask your health care provider how often you should have your eyes and teeth checked.  Stay up to  date on all vaccines. This information is not intended to replace advice given to you by your health care provider. Make sure you discuss any questions you have with your health care provider. Document Revised: 09/08/2020 Document Reviewed: 08/16/2018 Elsevier Patient Education  2021 [employer].

## 2021-03-01 ENCOUNTER — Ambulatory Visit (INDEPENDENT_AMBULATORY_CARE_PROVIDER_SITE_OTHER): Payer: 59 | Admitting: Family Medicine

## 2021-03-01 ENCOUNTER — Encounter: Payer: Self-pay | Admitting: Family Medicine

## 2021-03-01 ENCOUNTER — Other Ambulatory Visit: Payer: Self-pay

## 2021-03-01 VITALS — BP 120/80 | HR 68 | Ht 62.25 in | Wt 129.0 lb

## 2021-03-01 DIAGNOSIS — M25561 Pain in right knee: Secondary | ICD-10-CM | POA: Diagnosis not present

## 2021-03-01 DIAGNOSIS — Z0000 Encounter for general adult medical examination without abnormal findings: Secondary | ICD-10-CM

## 2021-03-01 DIAGNOSIS — M810 Age-related osteoporosis without current pathological fracture: Secondary | ICD-10-CM | POA: Diagnosis not present

## 2021-03-01 DIAGNOSIS — Z1329 Encounter for screening for other suspected endocrine disorder: Secondary | ICD-10-CM

## 2021-03-01 DIAGNOSIS — E7800 Pure hypercholesterolemia, unspecified: Secondary | ICD-10-CM

## 2021-03-01 DIAGNOSIS — G8929 Other chronic pain: Secondary | ICD-10-CM

## 2021-03-01 MED ORDER — MELOXICAM 7.5 MG PO TABS: 7.5000 mg | ORAL_TABLET | Freq: Every day | ORAL | 0 refills | Status: DC

## 2021-03-02 LAB — CBC WITH DIFFERENTIAL/PLATELET
Basophils Absolute: 0.1 10*3/uL (ref 0.0–0.2)
Basos: 1 %
EOS (ABSOLUTE): 0.1 10*3/uL (ref 0.0–0.4)
Eos: 3 %
Hematocrit: 38.9 % (ref 34.0–46.6)
Hemoglobin: 12.9 g/dL (ref 11.1–15.9)
Immature Grans (Abs): 0 10*3/uL (ref 0.0–0.1)
Immature Granulocytes: 0 %
Lymphocytes Absolute: 1.5 10*3/uL (ref 0.7–3.1)
Lymphs: 29 %
MCH: 30.5 pg (ref 26.6–33.0)
MCHC: 33.2 g/dL (ref 31.5–35.7)
MCV: 92 fL (ref 79–97)
Monocytes Absolute: 0.4 10*3/uL (ref 0.1–0.9)
Monocytes: 9 %
Neutrophils Absolute: 2.9 10*3/uL (ref 1.4–7.0)
Neutrophils: 58 %
Platelets: 223 10*3/uL (ref 150–450)
RBC: 4.23 x10E6/uL (ref 3.77–5.28)
RDW: 12.5 % (ref 11.7–15.4)
WBC: 5 10*3/uL (ref 3.4–10.8)

## 2021-03-02 LAB — COMPREHENSIVE METABOLIC PANEL
ALT: 16 IU/L (ref 0–32)
AST: 24 IU/L (ref 0–40)
Albumin/Globulin Ratio: 2.4 — ABNORMAL HIGH (ref 1.2–2.2)
Albumin: 4.8 g/dL (ref 3.8–4.8)
Alkaline Phosphatase: 69 IU/L (ref 44–121)
BUN/Creatinine Ratio: 14 (ref 12–28)
BUN: 14 mg/dL (ref 8–27)
Bilirubin Total: 0.6 mg/dL (ref 0.0–1.2)
CO2: 20 mmol/L (ref 20–29)
Calcium: 9.5 mg/dL (ref 8.7–10.3)
Chloride: 103 mmol/L (ref 96–106)
Creatinine, Ser: 0.97 mg/dL (ref 0.57–1.00)
Globulin, Total: 2 g/dL (ref 1.5–4.5)
Glucose: 103 mg/dL — ABNORMAL HIGH (ref 65–99)
Potassium: 4.7 mmol/L (ref 3.5–5.2)
Sodium: 140 mmol/L (ref 134–144)
Total Protein: 6.8 g/dL (ref 6.0–8.5)
eGFR: 66 mL/min/{1.73_m2} (ref >59)

## 2021-03-02 LAB — T4, FREE: Free T4: 1.07 ng/dL (ref 0.82–1.77)

## 2021-03-02 LAB — LIPID PANEL
Chol/HDL Ratio: 3.3 ratio (ref 0.0–4.4)
Cholesterol, Total: 269 mg/dL — ABNORMAL HIGH (ref 100–199)
HDL: 82 mg/dL (ref >39)
LDL Chol Calc (NIH): 167 mg/dL — ABNORMAL HIGH (ref 0–99)
Triglycerides: 115 mg/dL (ref 0–149)
VLDL Cholesterol Cal: 20 mg/dL (ref 5–40)

## 2021-03-02 LAB — TSH: TSH: 3.85 u[IU]/mL (ref 0.450–4.500)

## 2021-03-17 ENCOUNTER — Other Ambulatory Visit: Payer: Self-pay | Admitting: Family Medicine

## 2021-03-17 DIAGNOSIS — M810 Age-related osteoporosis without current pathological fracture: Secondary | ICD-10-CM

## 2021-03-17 NOTE — Telephone Encounter (Signed)
Pt was notified of results

## 2021-03-28 ENCOUNTER — Other Ambulatory Visit: Payer: Self-pay | Admitting: Internal Medicine

## 2021-04-22 ENCOUNTER — Encounter: Payer: Self-pay | Admitting: Family Medicine

## 2021-04-28 ENCOUNTER — Other Ambulatory Visit: Payer: Self-pay | Admitting: Internal Medicine

## 2021-05-24 ENCOUNTER — Other Ambulatory Visit: Payer: Self-pay | Admitting: Internal Medicine

## 2021-05-25 ENCOUNTER — Other Ambulatory Visit: Payer: Self-pay | Admitting: Family Medicine

## 2021-05-25 DIAGNOSIS — Z1231 Encounter for screening mammogram for malignant neoplasm of breast: Secondary | ICD-10-CM

## 2021-06-17 ENCOUNTER — Other Ambulatory Visit: Payer: Self-pay | Admitting: Internal Medicine

## 2021-06-17 NOTE — Telephone Encounter (Signed)
Needs office visit before we can give more refills

## 2021-07-20 ENCOUNTER — Ambulatory Visit
Admission: RE | Admit: 2021-07-20 | Discharge: 2021-07-20 | Disposition: A | Discharge status: Home / Self Care | Payer: Managed Care, Other (non HMO) | Source: Ambulatory Visit | Attending: Family Medicine | Admitting: Family Medicine

## 2021-07-20 ENCOUNTER — Other Ambulatory Visit: Payer: Self-pay

## 2021-07-20 DIAGNOSIS — Z1231 Encounter for screening mammogram for malignant neoplasm of breast: Secondary | ICD-10-CM

## 2021-07-26 ENCOUNTER — Other Ambulatory Visit: Payer: Self-pay | Admitting: Family Medicine

## 2021-07-26 DIAGNOSIS — M810 Age-related osteoporosis without current pathological fracture: Secondary | ICD-10-CM

## 2021-07-29 ENCOUNTER — Encounter: Payer: Self-pay | Admitting: Family Medicine

## 2021-08-05 NOTE — Progress Notes (Signed)
[facility] [place] Phone: [phone] Subjective:   Allison Calderon, am serving as a scribe for Dr. Hulan Saas. This visit occurred during the SARS-CoV-2 public health emergency.  Safety protocols were in place, including screening questions prior to the visit, additional usage of staff PPE, and extensive cleaning of exam room while observing appropriate contact time as indicated for disinfecting solutions.   I'm seeing this patient by the request  of:  Girtha Rm, NP-C  CC: Right shoulder pain  [id number]  Allison Calderon is a 60 y.o. female coming in with complaint of R shoulder pain since earlier this summer (June) after doing upper body weight training ie military press 15# and lat raises 10#. Patient states that she was lifting upper body in June. Pain radiating into bicep. Painful with extension and ER. Painful to lift gallon of milk. Pain has improved but is still present. More painful in morning with improvement throughout the day.        Past Medical History:  Diagnosis Date   Age-related osteoporosis without current pathological fracture 07/22/2019   Allergy    Patient denies Calderon longer a problem   BREAST LUMP    Elevated LDL cholesterol level    Elevated serum creatinine 02/25/2020   GERD (gastroesophageal reflux disease)    Heart murmur    History of chicken pox    Hx: UTI (urinary tract infection)    Hyperlipidemia    Osteoarthritis, knee    R>L   Personal history of colonic adenomas 03/13/2013   02/2013 - 2 diminutive adenomas   Past Surgical History:  Procedure Laterality Date   COLONOSCOPY  02/2013   Social History   Socioeconomic History   Marital status: Married    Spouse name: Not on file   Number of children: Not on file   Years of education: Not on file   Highest education level: Not on file  Occupational History   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  Tobacco Use   Smoking  status: Former    Years: 30.00    Types: Cigarettes   Smokeless tobacco: Former    Quit date: 12/19/1978  Vaping Use   Vaping Use: Never used  Substance and Sexual Activity   Alcohol use: Yes    Alcohol/week: 0.0 standard drinks    Comment: on weekends only    Drug use: Calderon   Sexual activity: Not on file  Other Topics Concern   Not on file  Social History Narrative   Married   Barrister's clerk, 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   Former smoker, not now, Calderon drugs, 6 beers a week approximately   2 to 3 cups of coffee daily   Social Determinants of Health   Financial Resource Strain: Not on file  Food Insecurity: Not on file  Transportation Needs: Not on file  Physical Activity: Not on file  Stress: Not on file  Social Connections: Not on file   Calderon Known Allergies Family History  Problem Relation Age of Onset   Arthritis Mother    Glaucoma Mother        R eye stent 2015   Bladder Cancer Mother         2017- surgery   Osteoarthritis Mother    Dementia Mother    Hyperlipidemia Father    Heart disease Father        MI @ 61  Cancer Father        cholangio    CAD Father    Valvular heart disease Father    Diabetes Father    Gallbladder disease Father    Hypertension Paternal Grandfather    Stroke Paternal Grandfather    Esophageal cancer Paternal Uncle    Breast cancer Maternal Aunt    Barrett's esophagus Brother    Hyperlipidemia Brother    Other Brother        cardiac stent    Dementia Maternal Grandmother    Stroke Paternal Grandmother    Colon cancer Neg Hx    Rectal cancer Neg Hx    Stomach cancer Neg Hx    Liver cancer Neg Hx     Current Outpatient Medications (Endocrine & Metabolic):    alendronate (FOSAMAX) 70 MG tablet, TAKE 1 TABLET BY MOUTH EVERY 7 DAYS. TAKE WITH A FULL GLASS OF WATER ON AN EMPTY STOMACH.  Current Outpatient Medications (Cardiovascular):    nitroGLYCERIN (NITRO-DUR) 0.2 mg/hr patch, Apply 1/4 of a patch to  skin once daily.   Current Outpatient Medications (Analgesics):    meloxicam (MOBIC) 15 MG tablet, Take 1 tablet (15 mg total) by mouth daily.   meloxicam (MOBIC) 7.5 MG tablet, Take 1 tablet (7.5 mg total) by mouth daily.   Current Outpatient Medications (Other):    Calcium Carbonate-Vitamin D (CALTRATE 600+D PO), Take 1 tablet by mouth daily.   estradiol (ESTRACE) 0.1 MG/GM vaginal cream, PLACE 1/2 GRAM(S) 1 TO 3 X WEEKLY AS DIRECTED   famotidine (PEPCID) 40 MG tablet, TAKE 1 TABLET BY MOUTH EVERYDAY AT BEDTIME (FOR MORE REFILLS MAKE OFFICE VISIT)   OVER THE COUNTER MEDICATION, Magnesium, 250 mg, one tablet daily.   Reviewed prior external information including notes and imaging from  primary care provider As well as notes that were available from care everywhere and other healthcare systems.  Past medical history, social, surgical and family history all reviewed in electronic medical record.  Calderon pertanent information unless stated regarding to the chief complaint.   Review of Systems:  Calderon headache, visual changes, nausea, vomiting, diarrhea, constipation, dizziness, abdominal pain, skin rash, fevers, chills, night sweats, weight loss, swollen lymph nodes, body aches, joint swelling, chest pain, shortness of breath, mood changes. POSITIVE muscle aches  Objective  Blood pressure 120/78, pulse (!) 55, height '5\' 2"'$  (1.575 m), weight 127 lb (57.6 kg), last menstrual period 02/27/2015, SpO2 99 %.   General: Calderon apparent distress alert and oriented x3 mood and affect normal, dressed appropriately.  HEENT: Pupils equal, extraocular movements intact  Respiratory: Patient's speak in full sentences and does not appear short of breath  Cardiovascular: Calderon lower extremity edema, non tender, Calderon erythema  Gait normal with good balance and coordination.  MSK: Right shoulder exam shows the patient does have positive speeds.  Positive impingement with Neer and Hawkins.  Positive empty can sign.   Positive O'Brien  Limited muscular skeletal ultrasound was performed and interpreted by Hulan Saas, M   Limited ultrasound shows the patient does have hypoechoic changes within the bicep tendon sheath.  Patient does have what appears to be an abnormality noted of the anterior labrum.  Subscapularis is unremarkable but supraspinatus does have what appears to be a near full-thickness tear noted with reactive hypoechoic changes that is consistent with subacromial bursitis.  Acromioclavicular joint seems to be unremarkable but glenohumeral joint has mild arthritic changes as well as hypoechoic changes consistent with more of the labral pathology. Impression: Rotator  cuff.  Labral pathology    Impression and Recommendations:     The above documentation has been reviewed and is accurate and complete Lyndal Pulley, DO

## 2021-08-06 ENCOUNTER — Ambulatory Visit: Payer: Self-pay

## 2021-08-06 ENCOUNTER — Ambulatory Visit (INDEPENDENT_AMBULATORY_CARE_PROVIDER_SITE_OTHER): Payer: Managed Care, Other (non HMO)

## 2021-08-06 ENCOUNTER — Ambulatory Visit (INDEPENDENT_AMBULATORY_CARE_PROVIDER_SITE_OTHER): Payer: Managed Care, Other (non HMO) | Admitting: Family Medicine

## 2021-08-06 ENCOUNTER — Encounter: Payer: Self-pay | Admitting: Family Medicine

## 2021-08-06 ENCOUNTER — Other Ambulatory Visit: Payer: Self-pay

## 2021-08-06 VITALS — BP 120/78 | HR 55 | Ht 62.0 in | Wt 127.0 lb

## 2021-08-06 DIAGNOSIS — M25511 Pain in right shoulder: Secondary | ICD-10-CM

## 2021-08-06 DIAGNOSIS — G8929 Other chronic pain: Secondary | ICD-10-CM

## 2021-08-06 HISTORY — DX: Pain in right shoulder: M25.511

## 2021-08-06 MED ORDER — MELOXICAM 15 MG PO TABS: 15.0000 mg | ORAL_TABLET | Freq: Every day | ORAL | 0 refills | Status: DC

## 2021-08-06 MED ORDER — NITROGLYCERIN 0.2 MG/HR TD PT24: MEDICATED_PATCH | TRANSDERMAL | 0 refills | Status: DC

## 2021-08-06 NOTE — Assessment & Plan Note (Signed)
Patient does have right shoulder pain unfortunately exam as well as physical therapy does show signs that is consistent with a supraspinatus tear.  Seems to be nearly full-thickness.  In addition of this concerned that there is a possibility of a SLAP tear of the labrum.  Discussed with patient about different treatment options and patient has elected with home exercises, declined physical therapy, did decide on the nitroglycerin patches and warned of potential side effects.  Work with home exercises with Product/process development scientist today.  Meloxicam given for short course to decrease in the inflammation neck pain.  Follow-up again in 6 weeks worsening pain during to consider advanced imaging especially if there is any increasing in weakness.

## 2021-08-06 NOTE — Patient Instructions (Addendum)
Exercises Xray today Ice 2x 20 min a day Vit D 2000IU daily Meloxicam '15mg'$  daily for 10 days then as needed Nitroglycerin Protocol   Apply 1/4 nitroglycerin patch to affected area daily.  Change position of patch within the affected area every 24 hours.  You may experience a headache during the first 1-2 weeks of using the patch, these should subside.  If you experience headaches after beginning nitroglycerin patch treatment, you may take your preferred over the counter pain reliever.  Another side effect of the nitroglycerin patch is skin irritation or rash related to patch adhesive.  Please notify our office if you develop more severe headaches or rash, and stop the patch.  Tendon healing with nitroglycerin patch may require 12 to 24 weeks depending on the extent of injury.  Men should not use if taking Viagra, Cialis, or Levitra.   Do not use if you have migraines or rosacea.  See me again in 5-6 weeks

## 2021-08-19 ENCOUNTER — Other Ambulatory Visit (INDEPENDENT_AMBULATORY_CARE_PROVIDER_SITE_OTHER): Payer: Managed Care, Other (non HMO)

## 2021-08-19 ENCOUNTER — Other Ambulatory Visit: Payer: Self-pay

## 2021-08-19 DIAGNOSIS — Z23 Encounter for immunization: Secondary | ICD-10-CM | POA: Diagnosis not present

## 2021-09-10 NOTE — Progress Notes (Signed)
Zach Zane [facility] [place] [address] [place] Phone: [phone] Subjective:   [name], am serving as a scribe for Dr. Hulan Saas. This visit occurred during the SARS-CoV-2 public health emergency.  Safety protocols were in place, including screening questions prior to the visit, additional usage of staff PPE, and extensive cleaning of exam room while observing appropriate contact time as indicated for disinfecting solutions.   I'm seeing this patient by the request  of:  Girtha Rm, NP-C  CC: Right shoulder pain follow-up  [id number]  08/06/2021 Patient does have right shoulder pain unfortunately exam as well as physical therapy does show signs that is consistent with a supraspinatus tear.  Seems to be nearly full-thickness.  In addition of this concerned that there is a possibility of a SLAP tear of the labrum.  Discussed with patient about different treatment options and patient has elected with home exercises, declined physical therapy, did decide on the nitroglycerin patches and warned of potential side effects.  Work with home exercises with Product/process development scientist today.  Meloxicam given for short course to decrease in the inflammation neck pain.  Follow-up again in 6 weeks worsening pain during to consider advanced imaging especially if there is any increasing in weakness.  Update 09/13/2021 SHARUNDA SALMON is a 60 y.o. female coming in with complaint of R shoulder pain. Patient states she is getting progressively better. She had one incident of pain about a week ago (worse pain shes had in a while), but that was it. She has no new complaints. Overall does feel like she is making progress.     Past Medical History:  Diagnosis Date   Age-related osteoporosis without current pathological fracture 07/22/2019   Allergy    Patient denies no longer a problem   BREAST LUMP    Elevated LDL cholesterol level    Elevated serum creatinine  02/25/2020   GERD (gastroesophageal reflux disease)    Heart murmur    History of chicken pox    Hx: UTI (urinary tract infection)    Hyperlipidemia    Osteoarthritis, knee    R>L   Personal history of colonic adenomas 03/13/2013   02/2013 - 2 diminutive adenomas   Past Surgical History:  Procedure Laterality Date   COLONOSCOPY  02/2013   Social History   Socioeconomic History   Marital status: Married    Spouse name: Not on file   Number of children: Not on file   Years of education: Not on file   Highest education level: Not on file  Occupational History   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  Tobacco Use   Smoking status: Former    Years: 30.00    Types: Cigarettes   Smokeless tobacco: Former    Quit date: 12/19/1978  Vaping Use   Vaping Use: Never used  Substance and Sexual Activity   Alcohol use: Yes    Alcohol/week: 0.0 standard drinks    Comment: on weekends only    Drug use: No   Sexual activity: Not on file  Other Topics Concern   Not on file  Social History Narrative   Married   Barrister's clerk, 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   Former smoker, not now, no drugs, 6 beers a week approximately   2 to 3 cups of coffee daily   Social Determinants of Health   Financial Resource Strain: Not on file  Food Insecurity: Not on file  Transportation Needs: Not on file  Physical Activity: Not on file  Stress: Not on file  Social Connections: Not on file   No Known Allergies Family History  Problem Relation Age of Onset   Arthritis Mother    Glaucoma Mother        R eye stent 2015   Bladder Cancer Mother         2017- surgery   Osteoarthritis Mother    Dementia Mother    Hyperlipidemia Father    Heart disease Father        MI @ 68   Cancer Father        cholangio    CAD Father    Valvular heart disease Father    Diabetes Father    Gallbladder disease Father    Hypertension Paternal Grandfather    Stroke Paternal Grandfather     Esophageal cancer Paternal Uncle    Breast cancer Maternal Aunt    Barrett's esophagus Brother    Hyperlipidemia Brother    Other Brother        cardiac stent    Dementia Maternal Grandmother    Stroke Paternal Grandmother    Colon cancer Neg Hx    Rectal cancer Neg Hx    Stomach cancer Neg Hx    Liver cancer Neg Hx     Current Outpatient Medications (Endocrine & Metabolic):    alendronate (FOSAMAX) 70 MG tablet, TAKE 1 TABLET BY MOUTH EVERY 7 DAYS. TAKE WITH A FULL GLASS OF WATER ON AN EMPTY STOMACH.  Current Outpatient Medications (Cardiovascular):    nitroGLYCERIN (NITRO-DUR) 0.2 mg/hr patch, Apply 1/4 of a patch to skin once daily.   Current Outpatient Medications (Analgesics):    meloxicam (MOBIC) 15 MG tablet, Take 1 tablet (15 mg total) by mouth daily.   meloxicam (MOBIC) 7.5 MG tablet, Take 1 tablet (7.5 mg total) by mouth daily.   Current Outpatient Medications (Other):    Calcium Carbonate-Vitamin D (CALTRATE 600+D PO), Take 1 tablet by mouth daily.   estradiol (ESTRACE) 0.1 MG/GM vaginal cream, PLACE 1/2 GRAM(S) 1 TO 3 X WEEKLY AS DIRECTED   famotidine (PEPCID) 40 MG tablet, TAKE 1 TABLET BY MOUTH EVERYDAY AT BEDTIME (FOR MORE REFILLS MAKE OFFICE VISIT)   OVER THE COUNTER MEDICATION, Magnesium, 250 mg, one tablet daily.   Reviewed prior external information including notes and imaging from  primary care provider As well as notes that were available from care everywhere and other healthcare systems.  Past medical history, social, surgical and family history all reviewed in electronic medical record.  No pertanent information unless stated regarding to the chief complaint.   Review of Systems:  No headache, visual changes, nausea, vomiting, diarrhea, constipation, dizziness, abdominal pain, skin rash, fevers, chills, night sweats, weight loss, swollen lymph nodes, body aches, joint swelling, chest pain, shortness of breath, mood changes.   Objective  Blood pressure  102/70, pulse 62, height 5\' 2"  (1.575 m), weight 128 lb (58.1 kg), last menstrual period 02/27/2015, SpO2 97 %.   General: No apparent distress alert and oriented x3 mood and affect normal, dressed appropriately.  HEENT: Pupils equal, extraocular movements intact  Respiratory: Patient's speak in full sentences and does not appear short of breath  Cardiovascular: No lower extremity edema, non tender, no erythema  Gait normal with good balance and coordination.  MSK: Right shoulder exam shows the patient does still have some mild positive impingement.  Rotator cuff strength does seem to be improving but still seems  to have difficulty with empty can.  Limited muscular skeletal ultrasound was performed and interpreted by Hulan Saas, M  Limited ultrasound shows the patient does have some mild scarring noted of the rotator cuff where the tear was appreciated.  Still has some area though that does appear to have some possible retraction noted.  Mild hypoechoic changes noted in the area.  Does have improvement though when there is not as much effusion noted to the glenohumeral joint posteriorly.    Impression and Recommendations:    The above documentation has been reviewed and is accurate and complete Lyndal Pulley, DO

## 2021-09-13 ENCOUNTER — Encounter: Payer: Self-pay | Admitting: Family Medicine

## 2021-09-13 ENCOUNTER — Other Ambulatory Visit: Payer: Self-pay

## 2021-09-13 ENCOUNTER — Other Ambulatory Visit: Payer: Self-pay | Admitting: Family Medicine

## 2021-09-13 ENCOUNTER — Ambulatory Visit (INDEPENDENT_AMBULATORY_CARE_PROVIDER_SITE_OTHER): Payer: Managed Care, Other (non HMO) | Admitting: Family Medicine

## 2021-09-13 ENCOUNTER — Ambulatory Visit: Payer: Self-pay

## 2021-09-13 VITALS — BP 102/70 | HR 62 | Ht 62.0 in | Wt 128.0 lb

## 2021-09-13 DIAGNOSIS — G8929 Other chronic pain: Secondary | ICD-10-CM | POA: Diagnosis not present

## 2021-09-13 DIAGNOSIS — M25511 Pain in right shoulder: Secondary | ICD-10-CM

## 2021-09-13 MED ORDER — MELOXICAM 15 MG PO TABS: 15.0000 mg | ORAL_TABLET | Freq: Every day | ORAL | 2 refills | Status: DC

## 2021-09-13 NOTE — Patient Instructions (Signed)
Read on PRP Continue Nitro Restart exercises Ice before bed See you again in 6-8 weeks

## 2021-09-13 NOTE — Assessment & Plan Note (Signed)
Patient still has knee findings that is consistent with a rotator cuff tear.  Patient does have some mild scarring noted that does show may be potentially some time.  Maybe some mild neovascularization that could be secondary to the nitroglycerin patches.  Patient because she has made about a 50% improvement with her to continue with conservative therapy.  Discussed with patient about the possibility of PRP as well as MRI.  Patient would not want any surgical intervention so we will make any significant changes at this time.  Patient was having worsening pain now with some of the resistant pains but patient will start doing these again 2-3 times a week and see how she responds.  Follow-up with me again 6 to 8 weeks

## 2021-10-25 NOTE — Progress Notes (Signed)
Zach Balthazar [place] [address] [place] Phone: [phone] Subjective:   Allison Calderon, am serving as a scribe for Dr. Hulan Saas. This visit occurred during the SARS-CoV-2 public health emergency.  Safety protocols were in place, including screening questions prior to the visit, additional usage of staff PPE, and extensive cleaning of exam room while observing appropriate contact time as indicated for disinfecting solutions.   I'm seeing this patient by the request  of:  Girtha Rm, PA-C  CC: right shoulder pain   [id number]  09/13/2021 Patient still has knee findings that is consistent with a rotator cuff tear.  Patient does have some mild scarring noted that does show may be potentially some time.  Maybe some mild neovascularization that could be secondary to the nitroglycerin patches.  Patient because she has made about a 50% improvement with her to continue with conservative therapy.  Discussed with patient about the possibility of PRP as well as MRI.  Patient would not want any surgical intervention so we will make any significant changes at this time.  Patient was having worsening pain now with some of the resistant pains but patient will start doing these again 2-3 times a week and see how she responds.  Follow-up with me again 6 to 8 weeks  Update 10/26/2021 Allison Calderon is a 60 y.o. female coming in with complaint of R shoulder pain. Patient states shoulder is feeling much better. Feels like shes moving in a good direction. Pain is less, ROM is greater. She is able to lift items like a gallon of milk and she isn't as guarded when it comes to the shoulder. Still knows there is some work that needs to be done to be 100%      Past Medical History:  Diagnosis Date   Age-related osteoporosis without current pathological fracture 07/22/2019   Allergy    Patient denies no longer a problem   BREAST LUMP    Elevated LDL cholesterol  level    Elevated serum creatinine 02/25/2020   GERD (gastroesophageal reflux disease)    Heart murmur    History of chicken pox    Hx: UTI (urinary tract infection)    Hyperlipidemia    Osteoarthritis, knee    R>L   Personal history of colonic adenomas 03/13/2013   02/2013 - 2 diminutive adenomas   Past Surgical History:  Procedure Laterality Date   COLONOSCOPY  02/2013   Social History   Socioeconomic History   Marital status: Married    Spouse name: Not on file   Number of children: Not on file   Years of education: Not on file   Highest education level: Not on file  Occupational History   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  Tobacco Use   Smoking status: Former    Years: 30.00    Types: Cigarettes   Smokeless tobacco: Former    Quit date: 12/19/1978  Vaping Use   Vaping Use: Never used  Substance and Sexual Activity   Alcohol use: Yes    Alcohol/week: 0.0 standard drinks    Comment: on weekends only    Drug use: No   Sexual activity: Not on file  Other Topics Concern   Not on file  Social History Narrative   Married   Barrister's clerk, 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   Former smoker, not now, no drugs, 6 beers a week approximately   2 to  3 cups of coffee daily   Social Determinants of Health   Financial Resource Strain: Not on file  Food Insecurity: Not on file  Transportation Needs: Not on file  Physical Activity: Not on file  Stress: Not on file  Social Connections: Not on file   No Known Allergies Family History  Problem Relation Age of Onset   Arthritis Mother    Glaucoma Mother        R eye stent 2015   Bladder Cancer Mother         2017- surgery   Osteoarthritis Mother    Dementia Mother    Hyperlipidemia Father    Heart disease Father        MI @ 31   Cancer Father        cholangio    CAD Father    Valvular heart disease Father    Diabetes Father    Gallbladder disease Father    Hypertension Paternal Grandfather     Stroke Paternal Grandfather    Esophageal cancer Paternal Uncle    Breast cancer Maternal Aunt    Barrett's esophagus Brother    Hyperlipidemia Brother    Other Brother        cardiac stent    Dementia Maternal Grandmother    Stroke Paternal Grandmother    Colon cancer Neg Hx    Rectal cancer Neg Hx    Stomach cancer Neg Hx    Liver cancer Neg Hx     Current Outpatient Medications (Endocrine & Metabolic):    alendronate (FOSAMAX) 70 MG tablet, TAKE 1 TABLET BY MOUTH EVERY 7 DAYS. TAKE WITH A FULL GLASS OF WATER ON AN EMPTY STOMACH.  Current Outpatient Medications (Cardiovascular):    nitroGLYCERIN (NITRO-DUR) 0.2 mg/hr patch, Apply 1/4 of a patch to skin once daily.   Current Outpatient Medications (Analgesics):    meloxicam (MOBIC) 15 MG tablet, Take 1 tablet (15 mg total) by mouth daily.   meloxicam (MOBIC) 7.5 MG tablet, Take 1 tablet (7.5 mg total) by mouth daily.   Current Outpatient Medications (Other):    Calcium Carbonate-Vitamin D (CALTRATE 600+D PO), Take 1 tablet by mouth daily.   estradiol (ESTRACE) 0.1 MG/GM vaginal cream, PLACE 1/2 GRAM(S) 1 TO 3 X WEEKLY AS DIRECTED   famotidine (PEPCID) 40 MG tablet, TAKE 1 TABLET BY MOUTH EVERYDAY AT BEDTIME (FOR MORE REFILLS MAKE OFFICE VISIT)   OVER THE COUNTER MEDICATION, Magnesium, 250 mg, one tablet daily.    Objective  Blood pressure 104/66, pulse 80, height 5\' 2"  (1.575 m), weight 129 lb (58.5 kg), last menstrual period 02/27/2015, SpO2 99 %.   General: No apparent distress alert and oriented x3 mood and affect normal, dressed appropriately.  HEENT: Pupils equal, extraocular movements intact  Respiratory: Patient's speak in full sentences and does not appear short of breath  Cardiovascular: No lower extremity edema, non tender, no erythema  Gait normal with good balance and coordination.  MSK:  right shoulder does have improvement in strength significantly with 5 out of 5 strength of the rotator cuff.  Patient  still has some mild pain with external rotation.  Patient does have very mild positive crossover.  Limited muscular skeletal ultrasound was performed and interpreted by Hulan Saas, M  Limited ultrasound of patient's right shoulder shows the patient does still have some posterior fibers of the supraspinatus with some mild tear and very minimal retraction.  Scar tissue formation noted but does have improvement with tendon formation in the area  areas.  Patient does have moderate arthritic changes of the acromioclavicular joint with trace effusion noted. Impression: Interval improvement    Impression and Recommendations:     The above documentation has been reviewed and is accurate and complete Lyndal Pulley, DO

## 2021-10-26 ENCOUNTER — Ambulatory Visit: Payer: Self-pay

## 2021-10-26 ENCOUNTER — Ambulatory Visit (INDEPENDENT_AMBULATORY_CARE_PROVIDER_SITE_OTHER): Payer: Managed Care, Other (non HMO) | Admitting: Family Medicine

## 2021-10-26 ENCOUNTER — Other Ambulatory Visit: Payer: Self-pay

## 2021-10-26 VITALS — BP 104/66 | HR 80 | Ht 62.0 in | Wt 129.0 lb

## 2021-10-26 DIAGNOSIS — G8929 Other chronic pain: Secondary | ICD-10-CM

## 2021-10-26 DIAGNOSIS — M25511 Pain in right shoulder: Secondary | ICD-10-CM | POA: Diagnosis not present

## 2021-10-26 NOTE — Patient Instructions (Signed)
Good to see you! Keep up nitro patch move more posterior Take Meloxicam in 5-7 day burst Do more activity when tolerable See you again in 6-8 weeks

## 2021-10-26 NOTE — Assessment & Plan Note (Signed)
Patient has gotten near perfect patient discussed nitroglycerin for another month at this point as well as the meloxicam.  Increase activity slowly.  Follow-up with me again in 6 to 8 weeks at that point we will fully release patient to have her increase the activities.

## 2021-11-28 ENCOUNTER — Other Ambulatory Visit: Payer: Self-pay | Admitting: Family Medicine

## 2021-11-30 ENCOUNTER — Other Ambulatory Visit: Payer: Self-pay

## 2021-11-30 DIAGNOSIS — M810 Age-related osteoporosis without current pathological fracture: Secondary | ICD-10-CM

## 2021-11-30 MED ORDER — ALENDRONATE SODIUM 70 MG PO TABS: ORAL_TABLET | ORAL | 4 refills | Status: DC

## 2021-12-02 NOTE — Progress Notes (Signed)
Allison Calderon [address] [place] Phone: [phone] Subjective:   [name], am serving as a scribe for Dr. Hulan Saas. This visit occurred during the SARS-CoV-2 public health emergency.  Safety protocols were in place, including screening questions prior to the visit, additional usage of staff PPE, and extensive cleaning of exam room while observing appropriate contact time as indicated for disinfecting solutions.   I'm seeing this patient by the request  of:  Irene Pap, PA-C  CC: shoulder pain follow up   [id number]  10/26/2021 Patient has gotten near perfect patient discussed nitroglycerin for another month at this point as well as the meloxicam.  Increase activity slowly.  Follow-up with me again in 6 to 8 weeks at that point we will fully release patient to have her increase the activities.  Update 12/06/2021 Allison Calderon is a 60 y.o. female coming in with complaint of R shoulder pain. Patient states shoulder is doing better. Not 100% but in the 90's. Only pain when lifting heavy and sore for a few days. Will take meloxicam and the pain goes away. Patient is happy with the result so far.      Past Medical History:  Diagnosis Date   Age-related osteoporosis without current pathological fracture 07/22/2019   Allergy    Patient denies no longer a problem   BREAST LUMP    Elevated LDL cholesterol level    Elevated serum creatinine 02/25/2020   GERD (gastroesophageal reflux disease)    Heart murmur    History of chicken pox    Hx: UTI (urinary tract infection)    Hyperlipidemia    Osteoarthritis, knee    R>L   Personal history of colonic adenomas 03/13/2013   02/2013 - 2 diminutive adenomas   Past Surgical History:  Procedure Laterality Date   COLONOSCOPY  02/2013   Social History   Socioeconomic History   Marital status: Married    Spouse name: Not on file   Number of children: Not on file   Years of  education: Not on file   Highest education level: Not on file  Occupational History   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  Tobacco Use   Smoking status: Former    Years: 30.00    Types: Cigarettes   Smokeless tobacco: Former    Quit date: 12/19/1978  Vaping Use   Vaping Use: Never used  Substance and Sexual Activity   Alcohol use: Yes    Alcohol/week: 0.0 standard drinks    Comment: on weekends only    Drug use: No   Sexual activity: Not on file  Other Topics Concern   Not on file  Social History Narrative   Married   Barrister's clerk, 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   Former smoker, not now, no drugs, 6 beers a week approximately   2 to 3 cups of coffee daily   Social Determinants of Health   Financial Resource Strain: Not on file  Food Insecurity: Not on file  Transportation Needs: Not on file  Physical Activity: Not on file  Stress: Not on file  Social Connections: Not on file   No Known Allergies Family History  Problem Relation Age of Onset   Arthritis Mother    Glaucoma Mother        R eye stent 2015   Bladder Cancer Mother         2017- surgery  Osteoarthritis Mother    Dementia Mother    Hyperlipidemia Father    Heart disease Father        MI @ 26   Cancer Father        cholangio    CAD Father    Valvular heart disease Father    Diabetes Father    Gallbladder disease Father    Hypertension Paternal Grandfather    Stroke Paternal Grandfather    Esophageal cancer Paternal Uncle    Breast cancer Maternal Aunt    Barrett's esophagus Brother    Hyperlipidemia Brother    Other Brother        cardiac stent    Dementia Maternal Grandmother    Stroke Paternal Grandmother    Colon cancer Neg Hx    Rectal cancer Neg Hx    Stomach cancer Neg Hx    Liver cancer Neg Hx     Current Outpatient Medications (Endocrine & Metabolic):    alendronate (FOSAMAX) 70 MG tablet, TAKE 1 TABLET BY MOUTH EVERY 7 DAYS. TAKE WITH A FULL GLASS OF  WATER ON AN EMPTY STOMACH.  Current Outpatient Medications (Cardiovascular):    nitroGLYCERIN (NITRO-DUR) 0.2 mg/hr patch, Apply 1/4 of a patch to skin once daily.   Current Outpatient Medications (Analgesics):    meloxicam (MOBIC) 15 MG tablet, TAKE 1 TABLET (15 MG TOTAL) BY MOUTH DAILY.   meloxicam (MOBIC) 7.5 MG tablet, Take 1 tablet (7.5 mg total) by mouth daily.   Current Outpatient Medications (Other):    Calcium Carbonate-Vitamin D (CALTRATE 600+D PO), Take 1 tablet by mouth daily.   estradiol (ESTRACE) 0.1 MG/GM vaginal cream, PLACE 1/2 GRAM(S) 1 TO 3 X WEEKLY AS DIRECTED   famotidine (PEPCID) 40 MG tablet, TAKE 1 TABLET BY MOUTH EVERYDAY AT BEDTIME (FOR MORE REFILLS MAKE OFFICE VISIT)   OVER THE COUNTER MEDICATION, Magnesium, 250 mg, one tablet daily.    Objective  Blood pressure 122/78, pulse 63, height 5\' 2"  (1.575 m), weight 132 lb (59.9 kg), last menstrual period 02/27/2015, SpO2 98 %.   General: No apparent distress alert and oriented x3 mood and affect normal, dressed appropriately.  HEENT: Pupils equal, extraocular movements intact  Respiratory: Patient's speak in full sentences and does not appear short of breath  Cardiovascular: No lower extremity edema, non tender, no erythema  Gait normal with good balance and coordination.  MSK: Right shoulder exam has good range of motion.  5 out of 5 strength of the rotator cuff.  Some very mild discomfort with full internal and external range of motion otherwise unremarkable    Impression and Recommendations:     The above documentation has been reviewed and is accurate and complete Lyndal Pulley, DO

## 2021-12-06 ENCOUNTER — Ambulatory Visit (INDEPENDENT_AMBULATORY_CARE_PROVIDER_SITE_OTHER): Payer: Managed Care, Other (non HMO) | Admitting: Family Medicine

## 2021-12-06 ENCOUNTER — Other Ambulatory Visit: Payer: Self-pay

## 2021-12-06 ENCOUNTER — Ambulatory Visit: Payer: Self-pay

## 2021-12-06 VITALS — BP 122/78 | HR 63 | Ht 62.0 in | Wt 132.0 lb

## 2021-12-06 DIAGNOSIS — G8929 Other chronic pain: Secondary | ICD-10-CM | POA: Diagnosis not present

## 2021-12-06 DIAGNOSIS — M25511 Pain in right shoulder: Secondary | ICD-10-CM | POA: Diagnosis not present

## 2021-12-06 NOTE — Assessment & Plan Note (Signed)
Patient is extremely improved at this time.  No treatment.  Change in management.  Follow-up with me as needed

## 2022-03-04 ENCOUNTER — Other Ambulatory Visit: Payer: Self-pay | Admitting: Medical

## 2022-03-04 DIAGNOSIS — M810 Age-related osteoporosis without current pathological fracture: Secondary | ICD-10-CM

## 2022-03-07 ENCOUNTER — Other Ambulatory Visit: Payer: Self-pay

## 2022-03-07 ENCOUNTER — Encounter: Payer: Self-pay | Admitting: Physician Assistant

## 2022-03-07 ENCOUNTER — Ambulatory Visit (INDEPENDENT_AMBULATORY_CARE_PROVIDER_SITE_OTHER): Payer: Managed Care, Other (non HMO) | Admitting: Physician Assistant

## 2022-03-07 VITALS — BP 120/70 | HR 71 | Ht 62.0 in | Wt 130.6 lb

## 2022-03-07 DIAGNOSIS — K219 Gastro-esophageal reflux disease without esophagitis: Secondary | ICD-10-CM

## 2022-03-07 DIAGNOSIS — Z0000 Encounter for general adult medical examination without abnormal findings: Secondary | ICD-10-CM

## 2022-03-07 DIAGNOSIS — R7303 Prediabetes: Secondary | ICD-10-CM | POA: Diagnosis not present

## 2022-03-07 DIAGNOSIS — R7989 Other specified abnormal findings of blood chemistry: Secondary | ICD-10-CM

## 2022-03-07 DIAGNOSIS — M810 Age-related osteoporosis without current pathological fracture: Secondary | ICD-10-CM

## 2022-03-07 DIAGNOSIS — E782 Mixed hyperlipidemia: Secondary | ICD-10-CM | POA: Insufficient documentation

## 2022-03-07 MED ORDER — FAMOTIDINE 40 MG PO TABS: 40.0000 mg | ORAL_TABLET | Freq: Every day | ORAL | 3 refills | Status: DC

## 2022-03-07 NOTE — Assessment & Plan Note (Signed)
stable, continue famotidine 40 mg qd, avoid stomach irritants (tomatoes, oranges, lemons, limes, spicy/greasy food) ? ?

## 2022-03-07 NOTE — Patient Instructions (Signed)

## 2022-03-07 NOTE — Assessment & Plan Note (Signed)
controlled, lipid panel drawn today, eat a low fat diet, increase fiber intake (Benefiber or Metamucil, Cherrios,  oatmeal, beans, nuts, fruits and vegetables), limit saturated fats (in fried foods, red meat), can add OTC fish oil supplement, eat fish with Omega-3 fatty acids like salmon and tuna, exercise for 30 minutes 3 - 5 times a week, drink 8 - 10 glasses of water a day ? ? ?

## 2022-03-07 NOTE — Assessment & Plan Note (Signed)
Stable, CMP drawn today, stable, avoid nephrotoxic medicines like NSAIDS (Advil, Motrin, Ibuprofen, Aleve, Naproxen) ? ?

## 2022-03-07 NOTE — Assessment & Plan Note (Signed)
Stable, continue Fosamax 70 mg weekly, take OTC vitamin D3 and Calcium ?

## 2022-03-07 NOTE — Assessment & Plan Note (Addendum)
Stable, hgb a1c drawn today, last hgb a1c 5.3 on 02/24/2020, eat a low sugar, low carbohydrate diet ?

## 2022-03-07 NOTE — Progress Notes (Signed)
? ?Complete physical exam ? ? ?Patient: Allison Calderon   DOB: Nov 03, 1961   61 y.o. Female  MRN: [id number] ?Visit Date: 03/07/2022 ? ?Chief Complaint  ?Patient presents with  ? Annual Exam  ?  Fasting CPE. No other concerns  ? ?Subjective  ?  ?Allison Calderon is a 61 y.o. female who presents today for a complete physical exam.  ? ?Reports female is generally feeling well; is eating a healthy diet; doesn't eat fried food or fast food; is sleeping well at night; drinks 40 ounces of water a day; is exercising at home at the gym, not exercising, has a strenuous job but no regular exercise walks 35 minutes twice a day as well as 1 hour on the weekends; isometric upper body exercises; doesn't have any new issues to discuss  ? ? ?HPI ?HPI   ? ? Annual Exam   ? Additional comments: Fasting CPE. No other concerns ? ?  ?  ?Last edited by Deforest Hoyles, [place] on 03/07/2022  8:25 AM.  ?  ?  ? ? ?Past Medical History:  ?Diagnosis Date  ? Age-related osteoporosis without current pathological fracture 07/22/2019  ? Allergy   ? Patient denies no longer a problem  ? BREAST LUMP   ? Elevated LDL cholesterol level   ? Elevated serum creatinine 02/25/2020  ? GERD (gastroesophageal reflux disease)   ? Heart murmur   ? History of chicken pox   ? History of prediabetes 02/18/2019  ? Hx: UTI (urinary tract infection)   ? Hyperglycemia 01/09/2015  ? Hyperlipidemia   ? Osteoarthritis, knee   ? R>L  ? Personal history of colonic adenomas 03/13/2013  ? 02/2013 - 2 diminutive adenomas  ? Right shoulder pain 08/06/2021  ? Sinusitis 12/03/2011  ? ?Past Surgical History:  ?Procedure Laterality Date  ? COLONOSCOPY  02/2013  ? ?Social History  ? ?Socioeconomic History  ? Marital status: Married  ?  Spouse name: Not on file  ? Number of children: Not on file  ? Years of education: Not on file  ? Highest education level: Not on file  ?Occupational History  ? Occupation: Therapist, sports  ?  Employer: HOSPICE  ?Tobacco Use  ? Smoking status: Former  ?  Years: 30.00  ?  Types:  Cigarettes  ? Smokeless tobacco: Former  ?  Quit date: 12/19/1978  ?Vaping Use  ? Vaping Use: Never used  ?Substance and Sexual Activity  ? Alcohol use: Yes  ?  Alcohol/week: 0.0 standard drinks  ?  Comment: on weekends only   ? Drug use: No  ? Sexual activity: Not on file  ?Other Topics Concern  ? Not on file  ?Social History Narrative  ? Married  ? Hospice RN, previously Freight forwarder of the [facility]  ? Former smoker, not now, no drugs, 6 beers a week approximately  ? 2 to 3 cups of coffee daily  ? ?Social Determinants of Health  ? ?Financial Resource Strain: Not on file  ?Food Insecurity: Not on file  ?Transportation Needs: Not on file  ?Physical Activity: Not on file  ?Stress: Not on file  ?Social Connections: Not on file  ?Intimate Partner Violence: Not on file  ? ?Family Status  ?Relation Name Status  ? Mother  Deceased  ? Father  Deceased  ? PGF  (Not Specified)  ? Annamarie Major  (Not Specified)  ? Mat Aunt  (Not Specified)  ? Brother  Alive  ? Brother  Alive  ?  MGM  (Not Specified)  ? PGM  (Not Specified)  ? Neg Hx  (Not Specified)  ? ?Family History  ?Problem Relation Age of Onset  ? Arthritis Mother   ? Glaucoma Mother   ?     R eye stent 2015  ? Bladder Cancer Mother   ?      2017- surgery  ? Osteoarthritis Mother   ? Dementia Mother   ? Hyperlipidemia Father   ? Heart disease Father   ?     MI @ 39  ? Cancer Father   ?     cholangio   ? CAD Father   ? Valvular heart disease Father   ? Diabetes Father   ? Gallbladder disease Father   ? Hypertension Paternal Grandfather   ? Stroke Paternal Grandfather   ? Esophageal cancer Paternal Uncle   ? Breast cancer Maternal Aunt   ? Barrett's esophagus Brother   ? Hyperlipidemia Brother   ? Other Brother   ?     cardiac stent   ? Dementia Maternal Grandmother   ? Stroke Paternal Grandmother   ? Colon cancer Neg Hx   ? Rectal cancer Neg Hx   ? Stomach cancer Neg Hx   ? Liver cancer Neg Hx   ? ?No Known Allergies  ?Patient Care Team: ?Marcellina Millin as PCP - General (Physician Assistant) ?Gustavo Lah, NP as Nurse Practitioner (Obstetrics and Gynecology) ?Gatha Mayer, MD (Gastroenterology) ?Crista Luria, MD (Dermatology)  ? ?Medications: ?Outpatient Medications Prior to Visit  ?Medication Sig Note  ? alendronate (FOSAMAX) 70 MG tablet TAKE 1 TABLET BY MOUTH EVERY 7 DAYS. TAKE WITH A FULL GLASS OF WATER ON AN EMPTY STOMACH.   ? Calcium Carbonate-Vitamin D (CALTRATE 600+D PO) Take 1 tablet by mouth daily.   ? estradiol (ESTRACE) 0.1 MG/GM vaginal cream PLACE 1/2 GRAM(S) 1 TO 3 X WEEKLY AS DIRECTED   ? meloxicam (MOBIC) 15 MG tablet TAKE 1 TABLET (15 MG TOTAL) BY MOUTH DAILY.   ? OVER THE COUNTER MEDICATION Magnesium, 250 mg, one tablet daily.   ? [DISCONTINUED] famotidine (PEPCID) 40 MG tablet TAKE 1 TABLET BY MOUTH EVERYDAY AT BEDTIME (FOR MORE REFILLS MAKE OFFICE VISIT) 03/07/2022: Needs refill  ? [DISCONTINUED] meloxicam (MOBIC) 7.5 MG tablet Take 1 tablet (7.5 mg total) by mouth daily.   ? [DISCONTINUED] nitroGLYCERIN (NITRO-DUR) 0.2 mg/hr patch Apply 1/4 of a patch to skin once daily. (Patient not taking: Reported on 03/07/2022)   ? ?No facility-administered medications prior to visit.  ? ? ?Review of Systems  ?Constitutional:  Negative for activity change and fever.  ?HENT:  Negative for congestion, ear pain and voice change.   ?Eyes:  Negative for redness.  ?Respiratory:  Negative for cough.   ?Cardiovascular:  Negative for chest pain.  ?Gastrointestinal:  Negative for constipation and diarrhea.  ?Endocrine: Negative for polyuria.  ?Genitourinary:  Negative for flank pain.  ?Musculoskeletal:  Negative for gait problem and neck stiffness.  ?Skin:  Negative for color change and rash.  ?Neurological:  Negative for dizziness.  ?Hematological:  Negative for adenopathy.  ?Psychiatric/Behavioral:  Negative for agitation, behavioral problems and confusion.   ? ?Last metabolic panel ?Lab Results  ?Component Value Date  ? GLUCOSE 103 (H) 03/01/2021  ? NA 140  03/01/2021  ? K 4.7 03/01/2021  ? CL 103 03/01/2021  ? CO2 20 03/01/2021  ? BUN 14 03/01/2021  ? CREATININE 0.97 03/01/2021  ? EGFR 66 03/01/2021  ? CALCIUM  9.5 03/01/2021  ? PROT 6.8 03/01/2021  ? ALBUMIN 4.8 03/01/2021  ? LABGLOB 2.0 03/01/2021  ? AGRATIO 2.4 (H) 03/01/2021  ? BILITOT 0.6 03/01/2021  ? ALKPHOS 69 03/01/2021  ? AST 24 03/01/2021  ? ALT 16 03/01/2021  ? ?Last lipids ?Lab Results  ?Component Value Date  ? CHOL 269 (H) 03/01/2021  ? HDL 82 03/01/2021  ? LDLCALC 167 (H) 03/01/2021  ? LDLDIRECT 175.8 12/24/2013  ? TRIG 115 03/01/2021  ? CHOLHDL 3.3 03/01/2021  ? ?Last hemoglobin A1c ?Lab Results  ?Component Value Date  ? HGBA1C 5.3 02/24/2020  ? ?  ? ?The 10-year ASCVD risk score (Arnett DK, et al., 2019) is: 3.5% ? ? Objective  ?  ?BP 120/70   Pulse 71   Wt 130 lb 9.6 oz (59.2 kg)   LMP 02/27/2015   SpO2 97%   BMI 23.89 kg/m?  ? ?  ? ? ?Physical Exam ?Vitals and nursing note reviewed.  ?Constitutional:   ?   General: She is not in acute distress. ?   Appearance: Normal appearance. She is not ill-appearing.  ?HENT:  ?   Head: Normocephalic and atraumatic.  ?   Right Ear: Tympanic membrane, ear canal and external ear normal.  ?   Left Ear: Tympanic membrane, ear canal and external ear normal.  ?   Nose: No congestion.  ?Eyes:  ?   Extraocular Movements: Extraocular movements intact.  ?   Conjunctiva/sclera: Conjunctivae normal.  ?   Pupils: Pupils are equal, round, and reactive to light.  ?Neck:  ?   Vascular: No carotid bruit.  ?Cardiovascular:  ?   Rate and Rhythm: Normal rate and regular rhythm.  ?   Pulses: Normal pulses.  ?   Heart sounds: Normal heart sounds.  ?Pulmonary:  ?   Effort: Pulmonary effort is normal.  ?   Breath sounds: Normal breath sounds. No wheezing.  ?Abdominal:  ?   General: Bowel sounds are normal.  ?   Palpations: Abdomen is soft.  ?Musculoskeletal:     ?   General: Normal range of motion.  ?   Cervical back: Normal range of motion and neck supple.  ?   Right lower leg: No  edema.  ?   Left lower leg: No edema.  ?Skin: ?   General: Skin is warm and dry.  ?   Findings: No bruising.  ?Neurological:  ?   General: No focal deficit present.  ?   Mental Status: She is alert and or

## 2022-03-08 LAB — LIPID PANEL
Chol/HDL Ratio: 3.3 ratio (ref 0.0–4.4)
Cholesterol, Total: 254 mg/dL — ABNORMAL HIGH (ref 100–199)
HDL: 76 mg/dL (ref >39)
LDL Chol Calc (NIH): 160 mg/dL — ABNORMAL HIGH (ref 0–99)
Triglycerides: 104 mg/dL (ref 0–149)
VLDL Cholesterol Cal: 18 mg/dL (ref 5–40)

## 2022-03-08 LAB — CBC WITH DIFFERENTIAL/PLATELET
Basophils Absolute: 0.1 10*3/uL (ref 0.0–0.2)
Basos: 1 %
EOS (ABSOLUTE): 0.2 10*3/uL (ref 0.0–0.4)
Eos: 5 %
Hematocrit: 37.1 % (ref 34.0–46.6)
Hemoglobin: 12.8 g/dL (ref 11.1–15.9)
Immature Grans (Abs): 0 10*3/uL (ref 0.0–0.1)
Immature Granulocytes: 0 %
Lymphocytes Absolute: 1.7 10*3/uL (ref 0.7–3.1)
Lymphs: 37 %
MCH: 30.3 pg (ref 26.6–33.0)
MCHC: 34.5 g/dL (ref 31.5–35.7)
MCV: 88 fL (ref 79–97)
Monocytes Absolute: 0.5 10*3/uL (ref 0.1–0.9)
Monocytes: 10 %
Neutrophils Absolute: 2.2 10*3/uL (ref 1.4–7.0)
Neutrophils: 47 %
Platelets: 200 10*3/uL (ref 150–450)
RBC: 4.23 x10E6/uL (ref 3.77–5.28)
RDW: 12.3 % (ref 11.7–15.4)
WBC: 4.6 10*3/uL (ref 3.4–10.8)

## 2022-03-08 LAB — COMPREHENSIVE METABOLIC PANEL
ALT: 13 IU/L (ref 0–32)
AST: 25 IU/L (ref 0–40)
Albumin/Globulin Ratio: 1.9 (ref 1.2–2.2)
Albumin: 4.5 g/dL (ref 3.8–4.8)
Alkaline Phosphatase: 68 IU/L (ref 44–121)
BUN/Creatinine Ratio: 15 (ref 12–28)
BUN: 13 mg/dL (ref 8–27)
Bilirubin Total: 0.6 mg/dL (ref 0.0–1.2)
CO2: 22 mmol/L (ref 20–29)
Calcium: 9.9 mg/dL (ref 8.7–10.3)
Chloride: 105 mmol/L (ref 96–106)
Creatinine, Ser: 0.85 mg/dL (ref 0.57–1.00)
Globulin, Total: 2.4 g/dL (ref 1.5–4.5)
Glucose: 94 mg/dL (ref 70–99)
Potassium: 4.9 mmol/L (ref 3.5–5.2)
Sodium: 142 mmol/L (ref 134–144)
Total Protein: 6.9 g/dL (ref 6.0–8.5)
eGFR: 77 mL/min/{1.73_m2} (ref >59)

## 2022-03-08 LAB — HEMOGLOBIN A1C
Est. average glucose Bld gHb Est-mCnc: 111 mg/dL
Hgb A1c MFr Bld: 5.5 % (ref 4.8–5.6)

## 2022-03-24 ENCOUNTER — Other Ambulatory Visit: Payer: Self-pay | Admitting: Physician Assistant

## 2022-03-24 DIAGNOSIS — Z1231 Encounter for screening mammogram for malignant neoplasm of breast: Secondary | ICD-10-CM

## 2022-03-26 ENCOUNTER — Encounter: Payer: Self-pay | Admitting: Family Medicine

## 2022-05-20 ENCOUNTER — Telehealth: Payer: Self-pay | Admitting: Family Medicine

## 2022-05-20 NOTE — Telephone Encounter (Signed)
Juliann Pulse called in asking if Dr. Birdie Riddle would be willing to take her own as a new pt?  Please advise

## 2022-05-20 NOTE — Telephone Encounter (Signed)
Pt can be scheduled as new patient with Dr Birdie Riddle

## 2022-05-20 NOTE — Telephone Encounter (Signed)
Pt requesting to be new patient are you willing to take her on?

## 2022-05-20 NOTE — Telephone Encounter (Signed)
Happy to accept

## 2022-05-22 ENCOUNTER — Other Ambulatory Visit: Payer: Self-pay | Admitting: Physician Assistant

## 2022-05-22 DIAGNOSIS — M810 Age-related osteoporosis without current pathological fracture: Secondary | ICD-10-CM

## 2022-05-23 NOTE — Telephone Encounter (Signed)
Pt has been scheduled.  °

## 2022-06-13 ENCOUNTER — Ambulatory Visit (INDEPENDENT_AMBULATORY_CARE_PROVIDER_SITE_OTHER): Payer: BC Managed Care – PPO | Admitting: Family Medicine

## 2022-06-13 ENCOUNTER — Encounter: Payer: Self-pay | Admitting: Family Medicine

## 2022-06-13 VITALS — BP 108/70 | HR 78 | Temp 98.2°F | Resp 15 | Ht 62.0 in | Wt 127.0 lb

## 2022-06-13 DIAGNOSIS — Z23 Encounter for immunization: Secondary | ICD-10-CM | POA: Diagnosis not present

## 2022-06-13 DIAGNOSIS — F419 Anxiety disorder, unspecified: Secondary | ICD-10-CM | POA: Diagnosis not present

## 2022-06-13 DIAGNOSIS — M7061 Trochanteric bursitis, right hip: Secondary | ICD-10-CM | POA: Diagnosis not present

## 2022-06-13 DIAGNOSIS — M810 Age-related osteoporosis without current pathological fracture: Secondary | ICD-10-CM | POA: Diagnosis not present

## 2022-06-13 DIAGNOSIS — E782 Mixed hyperlipidemia: Secondary | ICD-10-CM

## 2022-06-14 NOTE — Assessment & Plan Note (Signed)
New to provider, ongoing for pt.  Last LDL was 160 but pt isn't on a statin bc calcium score was 0.  She is very active.  Has decreased wine intake.  Will repeat labs at her CPE next spring and start medication at that time if needed.  Pt expressed understanding and is in agreement w/ plan.

## 2022-06-14 NOTE — Assessment & Plan Note (Signed)
New.  Pt reports she has had sxs since childhood but not discussed this w/ anyone.  Will have occasional panic attacks but would like to manage w/o meds if possible.  Does meditation, exercises regularly.  Will follow.

## 2022-06-17 ENCOUNTER — Encounter: Payer: Self-pay | Admitting: Internal Medicine

## 2022-06-27 DIAGNOSIS — L814 Other melanin hyperpigmentation: Secondary | ICD-10-CM | POA: Diagnosis not present

## 2022-06-27 DIAGNOSIS — L821 Other seborrheic keratosis: Secondary | ICD-10-CM | POA: Diagnosis not present

## 2022-06-27 DIAGNOSIS — L578 Other skin changes due to chronic exposure to nonionizing radiation: Secondary | ICD-10-CM | POA: Diagnosis not present

## 2022-06-27 DIAGNOSIS — L820 Inflamed seborrheic keratosis: Secondary | ICD-10-CM | POA: Diagnosis not present

## 2022-06-27 DIAGNOSIS — D225 Melanocytic nevi of trunk: Secondary | ICD-10-CM | POA: Diagnosis not present

## 2022-07-11 DIAGNOSIS — Z124 Encounter for screening for malignant neoplasm of cervix: Secondary | ICD-10-CM | POA: Diagnosis not present

## 2022-07-11 DIAGNOSIS — Z6823 Body mass index (BMI) 23.0-23.9, adult: Secondary | ICD-10-CM | POA: Diagnosis not present

## 2022-07-11 DIAGNOSIS — Z01419 Encounter for gynecological examination (general) (routine) without abnormal findings: Secondary | ICD-10-CM | POA: Diagnosis not present

## 2022-07-11 LAB — HM PAP SMEAR

## 2022-08-01 ENCOUNTER — Ambulatory Visit
Admission: RE | Admit: 2022-08-01 | Discharge: 2022-08-01 | Disposition: A | Discharge status: Home / Self Care | Payer: BC Managed Care – PPO | Source: Ambulatory Visit | Attending: Physician Assistant | Admitting: Physician Assistant

## 2022-08-01 DIAGNOSIS — Z1231 Encounter for screening mammogram for malignant neoplasm of breast: Secondary | ICD-10-CM | POA: Diagnosis not present

## 2022-08-10 ENCOUNTER — Other Ambulatory Visit: Payer: Self-pay | Admitting: Physician Assistant

## 2022-08-10 DIAGNOSIS — M810 Age-related osteoporosis without current pathological fracture: Secondary | ICD-10-CM

## 2022-08-13 ENCOUNTER — Other Ambulatory Visit: Payer: Self-pay | Admitting: Family Medicine

## 2022-08-13 DIAGNOSIS — M810 Age-related osteoporosis without current pathological fracture: Secondary | ICD-10-CM

## 2022-09-01 ENCOUNTER — Encounter: Payer: Self-pay | Admitting: Family Medicine

## 2022-09-12 ENCOUNTER — Ambulatory Visit (INDEPENDENT_AMBULATORY_CARE_PROVIDER_SITE_OTHER): Payer: BC Managed Care – PPO | Admitting: Family Medicine

## 2022-09-12 DIAGNOSIS — Z23 Encounter for immunization: Secondary | ICD-10-CM

## 2022-09-12 DIAGNOSIS — M810 Age-related osteoporosis without current pathological fracture: Secondary | ICD-10-CM

## 2022-09-12 MED ORDER — ALENDRONATE SODIUM 70 MG PO TABS: ORAL_TABLET | ORAL | 0 refills | Status: DC

## 2022-09-12 NOTE — Progress Notes (Signed)
Pt presented today for 2nd shingles vaccine, first was administered without issue  Pt did also request refill fosamax today to the [facility] in [place] on [address], will supply refill as she is currently due  Also noted she sent results of pap via MyChart, did locate and abstract this message for results

## 2022-09-29 ENCOUNTER — Encounter: Payer: Self-pay | Admitting: Family Medicine

## 2022-09-30 DIAGNOSIS — Z23 Encounter for immunization: Secondary | ICD-10-CM | POA: Diagnosis not present

## 2022-12-03 ENCOUNTER — Other Ambulatory Visit: Payer: Self-pay | Admitting: Family Medicine

## 2022-12-03 DIAGNOSIS — M810 Age-related osteoporosis without current pathological fracture: Secondary | ICD-10-CM

## 2022-12-05 NOTE — Telephone Encounter (Signed)
Patient is requesting a refill of the following medications: Requested Prescriptions   Pending Prescriptions Disp Refills   alendronate (FOSAMAX) 70 MG tablet [Pharmacy Med Name: ALENDRONATE SODIUM 70 MG TAB] 12 tablet 0    Sig: TAKE 1 TABLET BY MOUTH ONCE EVERY WEEK WITH A FULL GLASS OF WATER ON AN EMPTY STOMACH.    Date of patient request: 12/05/22 Last office visit: 06/13/22 Date of last refill: 09/12/22 Last refill amount: 12

## 2022-12-06 ENCOUNTER — Other Ambulatory Visit: Payer: Self-pay

## 2022-12-06 DIAGNOSIS — M810 Age-related osteoporosis without current pathological fracture: Secondary | ICD-10-CM

## 2022-12-06 MED ORDER — ALENDRONATE SODIUM 70 MG PO TABS: ORAL_TABLET | ORAL | 0 refills | Status: DC

## 2022-12-15 ENCOUNTER — Ambulatory Visit
Admission: RE | Admit: 2022-12-15 | Discharge: 2022-12-15 | Disposition: A | Discharge status: Home / Self Care | Payer: BC Managed Care – PPO | Source: Ambulatory Visit | Attending: Family Medicine | Admitting: Family Medicine

## 2022-12-15 DIAGNOSIS — Z780 Asymptomatic menopausal state: Secondary | ICD-10-CM | POA: Diagnosis not present

## 2022-12-15 DIAGNOSIS — M8588 Other specified disorders of bone density and structure, other site: Secondary | ICD-10-CM | POA: Diagnosis not present

## 2022-12-15 DIAGNOSIS — M810 Age-related osteoporosis without current pathological fracture: Secondary | ICD-10-CM

## 2022-12-20 ENCOUNTER — Telehealth: Payer: Self-pay

## 2022-12-20 NOTE — Telephone Encounter (Signed)
Pt seen results Via my chart  

## 2022-12-20 NOTE — Telephone Encounter (Signed)
-----   Message from Midge Minium, MD sent at 12/19/2022 11:17 AM EST ----- Your bone density test shows continued osteopenia but your measurements are improving!  This is great news!  No changes at this time

## 2023-02-19 ENCOUNTER — Other Ambulatory Visit: Payer: Self-pay | Admitting: Physician Assistant

## 2023-02-19 DIAGNOSIS — K219 Gastro-esophageal reflux disease without esophagitis: Secondary | ICD-10-CM

## 2023-03-13 ENCOUNTER — Encounter: Payer: BC Managed Care – PPO | Admitting: Family Medicine

## 2023-03-13 ENCOUNTER — Encounter: Payer: Managed Care, Other (non HMO) | Admitting: Physician Assistant

## 2023-03-27 ENCOUNTER — Encounter: Payer: BC Managed Care – PPO | Admitting: Family Medicine

## 2023-04-03 ENCOUNTER — Encounter: Payer: Self-pay | Admitting: Family Medicine

## 2023-04-03 ENCOUNTER — Ambulatory Visit (INDEPENDENT_AMBULATORY_CARE_PROVIDER_SITE_OTHER): Payer: BC Managed Care – PPO | Admitting: Family Medicine

## 2023-04-03 VITALS — BP 120/70 | HR 72 | Temp 97.9°F | Resp 16 | Ht 62.5 in | Wt 130.1 lb

## 2023-04-03 DIAGNOSIS — M810 Age-related osteoporosis without current pathological fracture: Secondary | ICD-10-CM

## 2023-04-03 DIAGNOSIS — E782 Mixed hyperlipidemia: Secondary | ICD-10-CM

## 2023-04-03 DIAGNOSIS — Z0000 Encounter for general adult medical examination without abnormal findings: Secondary | ICD-10-CM | POA: Diagnosis not present

## 2023-04-03 LAB — HEPATIC FUNCTION PANEL
ALT: 19 U/L (ref 0–35)
AST: 23 U/L (ref 0–37)
Albumin: 4.6 g/dL (ref 3.5–5.2)
Alkaline Phosphatase: 68 U/L (ref 39–117)
Bilirubin, Direct: 0.1 mg/dL (ref 0.0–0.3)
Total Bilirubin: 1 mg/dL (ref 0.2–1.2)
Total Protein: 7.5 g/dL (ref 6.0–8.3)

## 2023-04-03 LAB — CBC WITH DIFFERENTIAL/PLATELET
Basophils Absolute: 0.1 10*3/uL (ref 0.0–0.1)
Basophils Relative: 1 % (ref 0.0–3.0)
Eosinophils Absolute: 0.1 10*3/uL (ref 0.0–0.7)
Eosinophils Relative: 2.3 % (ref 0.0–5.0)
HCT: 40.5 % (ref 36.0–46.0)
Hemoglobin: 13.9 g/dL (ref 12.0–15.0)
Lymphocytes Relative: 31.9 % (ref 12.0–46.0)
Lymphs Abs: 2 10*3/uL (ref 0.7–4.0)
MCHC: 34.4 g/dL (ref 30.0–36.0)
MCV: 89.8 fl (ref 78.0–100.0)
Monocytes Absolute: 0.5 10*3/uL (ref 0.1–1.0)
Monocytes Relative: 8.9 % (ref 3.0–12.0)
Neutro Abs: 3.4 10*3/uL (ref 1.4–7.7)
Neutrophils Relative %: 55.9 % (ref 43.0–77.0)
Platelets: 244 10*3/uL (ref 150.0–400.0)
RBC: 4.51 Mil/uL (ref 3.87–5.11)
RDW: 13.1 % (ref 11.5–15.5)
WBC: 6.2 10*3/uL (ref 4.0–10.5)

## 2023-04-03 LAB — LIPID PANEL
Cholesterol: 288 mg/dL — ABNORMAL HIGH (ref 0–200)
HDL: 83.4 mg/dL (ref >39.00)
LDL Cholesterol: 180 mg/dL — ABNORMAL HIGH (ref 0–99)
NonHDL: 204.4
Total CHOL/HDL Ratio: 3
Triglycerides: 121 mg/dL (ref 0.0–149.0)
VLDL: 24.2 mg/dL (ref 0.0–40.0)

## 2023-04-03 LAB — BASIC METABOLIC PANEL
BUN: 12 mg/dL (ref 6–23)
CO2: 26 mEq/L (ref 19–32)
Calcium: 9.5 mg/dL (ref 8.4–10.5)
Chloride: 103 mEq/L (ref 96–112)
Creatinine, Ser: 0.93 mg/dL (ref 0.40–1.20)
GFR: 65.37 mL/min (ref >60.00)
Glucose, Bld: 101 mg/dL — ABNORMAL HIGH (ref 70–99)
Potassium: 4.5 mEq/L (ref 3.5–5.1)
Sodium: 138 mEq/L (ref 135–145)

## 2023-04-03 LAB — VITAMIN D 25 HYDROXY (VIT D DEFICIENCY, FRACTURES): VITD: 45.81 ng/mL (ref 30.00–100.00)

## 2023-04-03 LAB — TSH: TSH: 3.76 u[IU]/mL (ref 0.35–5.50)

## 2023-04-03 NOTE — Progress Notes (Signed)
   Subjective:    Patient ID: Allison Calderon, female    DOB: [birth year], 62 y.o.   MRN: [id number]  HPI CPE- UTD on pap, mammo, Tdap, colonoscopy, DEXA  Patient Care Team    Relationship Specialty Notifications Start End  Sheliah Hatch, MD PCP - General Family Medicine  08/31/22   Arlana Lindau, NP Nurse Practitioner Obstetrics and Gynecology  12/14/12   Iva Boop, MD  Gastroenterology  12/24/13   Campbell Stall, MD  Dermatology  12/24/13     Health Maintenance  Topic Date Due   INFLUENZA VACCINE  07/20/2023   PAP SMEAR-Modifier  02/05/2024   MAMMOGRAM  08/01/2024   DTaP/Tdap/Td (4 - Td or Tdap) 02/09/2027   COLONOSCOPY (Pts 45-37yrs Insurance coverage will need to be confirmed)  05/15/2028   Hepatitis C Screening  Completed   HIV Screening  Completed   Zoster Vaccines- Shingrix  Completed   HPV VACCINES  Aged Out   COVID-19 Vaccine  Discontinued      Review of Systems Patient reports no vision/ hearing changes, adenopathy,fever, weight change,  persistant/recurrent hoarseness , swallowing issues, chest pain, palpitations, edema, persistant/recurrent cough, hemoptysis, dyspnea (rest/exertional/paroxysmal nocturnal), gastrointestinal bleeding (melena, rectal bleeding), abdominal pain, significant heartburn, bowel changes, GU symptoms (dysuria, hematuria, incontinence), Gyn symptoms (abnormal  bleeding, pain),  syncope, focal weakness, memory loss, numbness & tingling, skin/hair/nail changes, abnormal bruising or bleeding, anxiety, or depression.     Objective:   Physical Exam General Appearance:    Alert, cooperative, no distress, appears stated age  Head:    Normocephalic, without obvious abnormality, atraumatic  Eyes:    PERRL, conjunctiva/corneas clear, EOM's intact both eyes  Ears:    Normal TM's and external ear canals, both ears  Nose:   Nares normal, septum midline, mucosa normal, no drainage    or sinus tenderness  Throat:   Lips, mucosa, and tongue normal; teeth  and gums normal  Neck:   Supple, symmetrical, trachea midline, no adenopathy;    Thyroid: no enlargement/tenderness/nodules  Back:     Symmetric, no curvature, ROM normal, no CVA tenderness  Lungs:     Clear to auscultation bilaterally, respirations unlabored  Chest Wall:    No tenderness or deformity   Heart:    Regular rate and rhythm, S1 and S2 normal, no murmur, rub   or gallop  Breast Exam:    Deferred to GYN  Abdomen:     Soft, non-tender, bowel sounds active all four quadrants,    no masses, no organomegaly  Genitalia:    Deferred to GYN  Rectal:    Extremities:   Extremities normal, atraumatic, no cyanosis or edema  Pulses:   2+ and symmetric all extremities  Skin:   Skin color, texture, turgor normal, no rashes or lesions  Lymph nodes:   Cervical, supraclavicular, and axillary nodes normal  Neurologic:   CNII-XII intact, normal strength, sensation and reflexes    throughout          Assessment & Plan:

## 2023-04-03 NOTE — Assessment & Plan Note (Signed)
Check labs and determine if medication is needed 

## 2023-04-03 NOTE — Assessment & Plan Note (Signed)
Pt's PE WNL.  UTD on pap, mammo, Tdap, colonoscopy, DEXA.  Check labs.  Anticipatory guidance provided.

## 2023-04-03 NOTE — Assessment & Plan Note (Signed)
UTD on DEXA.  Check Vit D and replete prn. 

## 2023-04-03 NOTE — Patient Instructions (Signed)
Follow up in 1 year or as needed We'll notify you of your lab results and make any changes if needed Keep up the good work on healthy diet and regular exercise- you look great!!! Call with any questions or concerns Stay Safe!  Stay Healthy! Happy Spring!!! 

## 2023-04-04 ENCOUNTER — Telehealth: Payer: Self-pay

## 2023-04-04 NOTE — Telephone Encounter (Signed)
-----   Message from Sheliah Hatch, MD sent at 04/04/2023  7:19 AM EDT ----- Labs look great w/ exception of LDL (bad cholesterol).  Ideally we want this between 100-130 in most people.  At 180, your level is high but thankfully your HDL (good cholesterol) is fantastic so this balances out the ratio.  You can add OTC Red Yeast Rice daily to help lower these numbers or we can start a low dose Crestor  to get your LDL back closer to range.  Let me know how you want to proceed.  Remainder of labs look great!

## 2023-04-04 NOTE — Telephone Encounter (Signed)
Informed pt of results and she states she will try the OTC red yeast rice supplement first

## 2023-04-24 ENCOUNTER — Encounter: Payer: Self-pay | Admitting: Family Medicine

## 2023-04-26 ENCOUNTER — Other Ambulatory Visit: Payer: Self-pay | Admitting: Family Medicine

## 2023-04-26 DIAGNOSIS — Z1231 Encounter for screening mammogram for malignant neoplasm of breast: Secondary | ICD-10-CM

## 2023-05-02 ENCOUNTER — Other Ambulatory Visit: Payer: Self-pay | Admitting: Family Medicine

## 2023-05-02 ENCOUNTER — Encounter: Payer: Self-pay | Admitting: Family Medicine

## 2023-05-02 DIAGNOSIS — M810 Age-related osteoporosis without current pathological fracture: Secondary | ICD-10-CM

## 2023-05-11 ENCOUNTER — Encounter: Payer: Self-pay | Admitting: Family Medicine

## 2023-07-11 DIAGNOSIS — L821 Other seborrheic keratosis: Secondary | ICD-10-CM | POA: Diagnosis not present

## 2023-07-11 DIAGNOSIS — L578 Other skin changes due to chronic exposure to nonionizing radiation: Secondary | ICD-10-CM | POA: Diagnosis not present

## 2023-07-11 DIAGNOSIS — D225 Melanocytic nevi of trunk: Secondary | ICD-10-CM | POA: Diagnosis not present

## 2023-07-11 DIAGNOSIS — L814 Other melanin hyperpigmentation: Secondary | ICD-10-CM | POA: Diagnosis not present

## 2023-07-19 ENCOUNTER — Other Ambulatory Visit: Payer: Self-pay | Admitting: Family Medicine

## 2023-07-19 DIAGNOSIS — M810 Age-related osteoporosis without current pathological fracture: Secondary | ICD-10-CM

## 2023-08-04 ENCOUNTER — Ambulatory Visit: Payer: BC Managed Care – PPO

## 2023-08-08 ENCOUNTER — Ambulatory Visit
Admission: RE | Admit: 2023-08-08 | Discharge: 2023-08-08 | Disposition: A | Discharge status: Home / Self Care | Payer: BC Managed Care – PPO | Source: Ambulatory Visit | Attending: Family Medicine | Admitting: Family Medicine

## 2023-08-08 DIAGNOSIS — Z1231 Encounter for screening mammogram for malignant neoplasm of breast: Secondary | ICD-10-CM | POA: Diagnosis not present

## 2023-08-22 ENCOUNTER — Ambulatory Visit (INDEPENDENT_AMBULATORY_CARE_PROVIDER_SITE_OTHER): Payer: BC Managed Care – PPO

## 2023-08-22 DIAGNOSIS — Z23 Encounter for immunization: Secondary | ICD-10-CM | POA: Diagnosis not present

## 2023-08-22 NOTE — Progress Notes (Signed)
Pt received her Flu vaccine tolerated well

## 2023-09-13 DIAGNOSIS — Z23 Encounter for immunization: Secondary | ICD-10-CM | POA: Diagnosis not present

## 2023-10-17 ENCOUNTER — Other Ambulatory Visit: Payer: Self-pay | Admitting: Family Medicine

## 2023-10-17 DIAGNOSIS — M810 Age-related osteoporosis without current pathological fracture: Secondary | ICD-10-CM

## 2023-11-23 DIAGNOSIS — Z1331 Encounter for screening for depression: Secondary | ICD-10-CM | POA: Diagnosis not present

## 2023-11-23 DIAGNOSIS — Z01419 Encounter for gynecological examination (general) (routine) without abnormal findings: Secondary | ICD-10-CM | POA: Diagnosis not present

## 2023-12-25 ENCOUNTER — Other Ambulatory Visit: Payer: Self-pay | Admitting: Medical Genetics

## 2024-01-08 ENCOUNTER — Other Ambulatory Visit: Payer: Self-pay | Admitting: Family Medicine

## 2024-01-08 ENCOUNTER — Encounter: Payer: BC Managed Care – PPO | Admitting: Family Medicine

## 2024-01-08 DIAGNOSIS — M810 Age-related osteoporosis without current pathological fracture: Secondary | ICD-10-CM

## 2024-01-22 ENCOUNTER — Other Ambulatory Visit (HOSPITAL_COMMUNITY)
Admission: RE | Admit: 2024-01-22 | Discharge: 2024-01-22 | Disposition: A | Discharge status: Home / Self Care | Payer: Self-pay | Source: Ambulatory Visit | Attending: Oncology | Admitting: Oncology

## 2024-02-01 LAB — GENECONNECT MOLECULAR SCREEN: Genetic Analysis Overall Interpretation: NEGATIVE

## 2024-02-11 ENCOUNTER — Other Ambulatory Visit: Payer: Self-pay | Admitting: Family Medicine

## 2024-02-11 DIAGNOSIS — K219 Gastro-esophageal reflux disease without esophagitis: Secondary | ICD-10-CM

## 2024-02-19 ENCOUNTER — Telehealth: Admitting: Physician Assistant

## 2024-02-19 DIAGNOSIS — R6889 Other general symptoms and signs: Secondary | ICD-10-CM

## 2024-02-19 MED ORDER — BENZONATATE 100 MG PO CAPS: 100.0000 mg | ORAL_CAPSULE | Freq: Three times a day (TID) | ORAL | 0 refills | Status: DC | PRN

## 2024-02-19 MED ORDER — ALBUTEROL SULFATE HFA 108 (90 BASE) MCG/ACT IN AERS: 1.0000 | INHALATION_SPRAY | Freq: Four times a day (QID) | RESPIRATORY_TRACT | 0 refills | Status: AC | PRN

## 2024-02-19 MED ORDER — PROMETHAZINE-DM 6.25-15 MG/5ML PO SYRP: 5.0000 mL | ORAL_SOLUTION | Freq: Four times a day (QID) | ORAL | 0 refills | Status: DC | PRN

## 2024-02-19 MED ORDER — OSELTAMIVIR PHOSPHATE 75 MG PO CAPS: 75.0000 mg | ORAL_CAPSULE | Freq: Two times a day (BID) | ORAL | 0 refills | Status: DC

## 2024-02-19 NOTE — Patient Instructions (Signed)
 Allison Calderon, thank you for joining Margaretann Loveless, PA-C for today's virtual visit.  While this provider is not your primary care provider (PCP), if your PCP is located in our provider database this encounter information will be shared with them immediately following your visit.   A [facility] MyChart account gives you access to today's visit and all your visits, tests, and labs performed at [facility] " click here if you don't have a [facility] MyChart account or go to [email or web address]  Consent: (Patient) Allison Calderon provided verbal consent for this virtual visit at the beginning of the encounter.  Current Medications:  Current Outpatient Medications:    albuterol (VENTOLIN HFA) 108 (90 Base) MCG/ACT inhaler, Inhale 1-2 puffs into the lungs every 6 (six) hours as needed., Disp: 8 g, Rfl: 0   benzonatate (TESSALON) 100 MG capsule, Take 1-2 capsules (100-200 mg total) by mouth 3 (three) times daily as needed., Disp: 30 capsule, Rfl: 0   oseltamivir (TAMIFLU) 75 MG capsule, Take 1 capsule (75 mg total) by mouth 2 (two) times daily., Disp: 10 capsule, Rfl: 0   promethazine-dextromethorphan (PROMETHAZINE-DM) 6.25-15 MG/5ML syrup, Take 5 mLs by mouth 4 (four) times daily as needed., Disp: 118 mL, Rfl: 0   alendronate (FOSAMAX) 70 MG tablet, TAKE 1 TABLET BY MOUTH ONCE EVERY WEEK WITH A FULL GLASS OF WATER ON AN EMPTY STOMACH., Disp: 12 tablet, Rfl: 0   Calcium Carbonate-Vitamin D (CALTRATE 600+D PO), Take 1 tablet by mouth daily., Disp: , Rfl:    Cholecalciferol (D3 2000) 50 MCG (2000 UT) CAPS, Take 1 tablet by mouth daily., Disp: , Rfl:    estradiol (ESTRACE) 0.1 MG/GM vaginal cream, PLACE 1/2 GRAM(S) 1 TO 3 X WEEKLY AS DIRECTED, Disp: , Rfl:    famotidine (PEPCID) 40 MG tablet, TAKE 1 TABLET BY MOUTH EVERY DAY, Disp: 90 tablet, Rfl: 3   Medications ordered in this encounter:  Meds ordered this encounter  Medications   oseltamivir (TAMIFLU) 75 MG capsule     Sig: Take 1 capsule (75 mg total) by mouth 2 (two) times daily.    Dispense:  10 capsule    Refill:  0    Supervising Provider:   Merrilee Jansky [[id number]]   benzonatate (TESSALON) 100 MG capsule    Sig: Take 1-2 capsules (100-200 mg total) by mouth 3 (three) times daily as needed.    Dispense:  30 capsule    Refill:  0    Supervising Provider:   Merrilee Jansky [[id number]]   promethazine-dextromethorphan (PROMETHAZINE-DM) 6.25-15 MG/5ML syrup    Sig: Take 5 mLs by mouth 4 (four) times daily as needed.    Dispense:  118 mL    Refill:  0    Supervising Provider:   Merrilee Jansky [[id number]]   albuterol (VENTOLIN HFA) 108 (90 Base) MCG/ACT inhaler    Sig: Inhale 1-2 puffs into the lungs every 6 (six) hours as needed.    Dispense:  8 g    Refill:  0    Supervising Provider:   Merrilee Jansky [[id number]]     *If you need refills on other medications prior to your next appointment, please contact your pharmacy*  Follow-Up: Call back or seek an in-person evaluation if the symptoms worsen or if the condition fails to improve as anticipated.   Virtual Care [phone]  Other Instructions  Influenza, Adult Influenza is also called the flu. It's an infection that affects your respiratory tract. This  includes your nose, throat, windpipe, and lungs. The flu is contagious. This means it spreads easily from person to person. It causes symptoms that are like a cold. It can also cause a high fever and body aches. What are the causes? The flu is caused by the influenza virus. You can get it by: Breathing in droplets that are in the air after an infected person coughs or sneezes. Touching something that has the virus on it and then touching your mouth, nose, or eyes. What increases the risk? You may be more likely to get the flu if: You don't wash your hands often. You're near a lot of people during cold and flu season. You touch your mouth, eyes, or nose without washing your  hands first. You don't get a flu shot each year. You may also be more at risk for the flu and serious problems, such as a lung infection called pneumonia, if: You're older than 65. You're pregnant. Your immune system is weak. Your immune system is your body's defense system. You have a long-term, or chronic, condition, such as: Heart, kidney, or lung disease. Diabetes. A liver disorder. Asthma. You're very overweight. You have anemia. This is when you don't have enough red blood cells in your body. What are the signs or symptoms? Flu symptoms often start all of a sudden. They may last 4-14 days and include: Fever and chills. Headaches, body aches, or muscle aches. Sore throat. Cough. Runny or stuffy nose. Discomfort in your chest. Not wanting to eat as much as normal. Feeling weak or tired. Feeling dizzy. Nausea or vomiting. How is this diagnosed? The flu may be diagnosed based on your symptoms and medical history. You may also have a physical exam. A swab may be taken from your nose or throat and tested for the virus. How is this treated? If the flu is found early, you can be treated with antiviral medicine. This may be given to you by mouth or through an IV. It can help you feel less sick and get better faster. Taking care of yourself at home can also help your symptoms get better. Your health care provider may tell you to: Take over-the-counter medicines. Drink lots of fluids. The flu often goes away on its own. If you have very bad symptoms or problems caused by the flu, you may need to be treated in a hospital. Follow these instructions at home: Activity Rest as needed. Get lots of sleep. Stay home from work or school as told by your provider. Leave home only to go see your provider. Do not leave home for other reasons until you don't have a fever for 24 hours without taking medicine. Eating and drinking Take an oral rehydration solution (ORS). This is a drink that is  sold at pharmacies and stores. Drink enough fluid to keep your pee pale yellow. Try to drink small amounts of clear fluids. These include water, ice chips, fruit juice mixed with water, and low-calorie sports drinks. Try to eat bland foods that are easy to digest. These include bananas, applesauce, rice, lean meats, toast, and crackers. Avoid drinks that have a lot of sugar or caffeine in them. These include energy drinks, regular sports drinks, and soda. Do not drink alcohol. Do not eat spicy or fatty foods. General instructions     Take your medicines only as told by your provider. Use a cool mist humidifier to add moisture to the air in your home. This can make it easier for you  to breathe. You should also clean the humidifier every day. To do so: Empty the water. Pour clean water in. Cover your mouth and nose when you cough or sneeze. Wash your hands with soap and water often and for at least 20 seconds. It's extra important to do so after you cough or sneeze. If you can't use soap and water, use hand sanitizer. How is this prevented?  Get a flu shot every year. Ask your provider when you should get your flu shot. Stay away from people who are sick during fall and winter. Fall and winter are cold and flu season. Contact a health care provider if: You get new symptoms. You have chest pain. You have watery poop, also called diarrhea. You have a fever. Your cough gets worse. You start to have more mucus. You feel like you may vomit, or you vomit. Get help right away if: You become short of breath or have trouble breathing. Your skin or nails turn blue. You have very bad pain or stiffness in your neck. You get a sudden headache or pain in your face or ear. You vomit each time you eat or drink. These symptoms may be an emergency. Call 911 right away. Do not wait to see if the symptoms will go away. Do not drive yourself to the hospital. This information is not intended to replace  advice given to you by your health care provider. Make sure you discuss any questions you have with your health care provider. Document Revised: 09/07/2023 Document Reviewed: 01/12/2023 Elsevier Patient Education  2024 Elsevier Inc.   If you have been instructed to have an in-person evaluation today at a local Urgent Care facility, please use the link below. It will take you to a list of all of our available [facility], including address, phone number and hours of operation. Please do not delay care.  [facility]  If you or a family member do not have a primary care provider, use the link below to schedule a visit and establish care. When you choose a [facility] primary care physician or advanced practice provider, you gain a long-term partner in health. Find a Primary Care Provider  Learn more about [facility]'s in-office and virtual care options: [facility] - Get Care Now

## 2024-02-19 NOTE — Progress Notes (Signed)
 Virtual Visit Consent   MERYEM HAERTEL, you are scheduled for a virtual visit with a [facility] provider today. Just as with appointments in the office, your consent must be obtained to participate. Your consent will be active for this visit and any virtual visit you may have with one of our providers in the next 365 days. If you have a MyChart account, a copy of this consent can be sent to you electronically.  As this is a virtual visit, video technology does not allow for your provider to perform a traditional examination. This may limit your provider's ability to fully assess your condition. If your provider identifies any concerns that need to be evaluated in person or the need to arrange testing (such as labs, EKG, etc.), we will make arrangements to do so. Although advances in technology are sophisticated, we cannot ensure that it will always work on either your end or our end. If the connection with a video visit is poor, the visit may have to be switched to a telephone visit. With either a video or telephone visit, we are not always able to ensure that we have a secure connection.  By engaging in this virtual visit, you consent to the provision of healthcare and authorize for your insurance to be billed (if applicable) for the services provided during this visit. Depending on your insurance coverage, you may receive a charge related to this service.  I need to obtain your verbal consent now. Are you willing to proceed with your visit today? RENESSA WELLNITZ has provided verbal consent on 02/19/2024 for a virtual visit (video or telephone). Margaretann Loveless, PA-C  Date: 02/19/2024 2:33 PM   Virtual Visit via Video Note   I, Margaretann Loveless, connected with  LUTRICIA WIDJAJA  ([id number], 1961/05/29) on 02/19/24 at  2:30 PM EST by a video-enabled telemedicine application and verified that I am speaking with the correct person using two identifiers.  Location: Patient: Virtual Visit  Location Patient: Home Provider: Virtual Visit Location Provider: Home Office   I discussed the limitations of evaluation and management by telemedicine and the availability of in person appointments. The patient expressed understanding and agreed to proceed.    History of Present Illness: Allison Calderon is a 63 y.o. who identifies as a female who was assigned female at birth, and is being seen today for flu-like illness.  HPI: URI  This is a new problem. The current episode started yesterday. The problem has been gradually worsening. The maximum temperature recorded prior to her arrival was 102 - 102.9 F (102 highest, down to 101.1 now). Associated symptoms include chest pain, congestion, coughing, headaches, a sore throat and wheezing. Pertinent negatives include no diarrhea, ear pain, nausea, plugged ear sensation, rhinorrhea or sinus pain. Associated symptoms comments: Chills, body aches. She has tried acetaminophen, NSAIDs and increased fluids for the symptoms. The treatment provided no relief.     Problems:  Patient Active Problem List   Diagnosis Date Noted   Physical exam 04/03/2023   Anxiety 06/13/2022   Trochanteric bursitis of right hip 06/13/2022   Prediabetes 03/07/2022   Mixed hyperlipidemia 03/07/2022   GERD without esophagitis 03/07/2022   Elevated serum creatinine 02/25/2020   Age-related osteoporosis without current pathological fracture 07/22/2019   History of colonic polyps 03/13/2013    Allergies: No Known Allergies Medications:  Current Outpatient Medications:    albuterol (VENTOLIN HFA) 108 (90 Base) MCG/ACT inhaler, Inhale 1-2 puffs into the lungs every 6 (  six) hours as needed., Disp: 8 g, Rfl: 0   benzonatate (TESSALON) 100 MG capsule, Take 1-2 capsules (100-200 mg total) by mouth 3 (three) times daily as needed., Disp: 30 capsule, Rfl: 0   oseltamivir (TAMIFLU) 75 MG capsule, Take 1 capsule (75 mg total) by mouth 2 (two) times daily., Disp: 10 capsule, Rfl:  0   promethazine-dextromethorphan (PROMETHAZINE-DM) 6.25-15 MG/5ML syrup, Take 5 mLs by mouth 4 (four) times daily as needed., Disp: 118 mL, Rfl: 0   alendronate (FOSAMAX) 70 MG tablet, TAKE 1 TABLET BY MOUTH ONCE EVERY WEEK WITH A FULL GLASS OF WATER ON AN EMPTY STOMACH., Disp: 12 tablet, Rfl: 0   Calcium Carbonate-Vitamin D (CALTRATE 600+D PO), Take 1 tablet by mouth daily., Disp: , Rfl:    Cholecalciferol (D3 2000) 50 MCG (2000 UT) CAPS, Take 1 tablet by mouth daily., Disp: , Rfl:    estradiol (ESTRACE) 0.1 MG/GM vaginal cream, PLACE 1/2 GRAM(S) 1 TO 3 X WEEKLY AS DIRECTED, Disp: , Rfl:    famotidine (PEPCID) 40 MG tablet, TAKE 1 TABLET BY MOUTH EVERY DAY, Disp: 90 tablet, Rfl: 3  Observations/Objective: Patient is well-developed, well-nourished in no acute distress.  Resting comfortably at home.  Head is normocephalic, atraumatic.  No labored breathing.  Speech is clear and coherent with logical content.  Patient is alert and oriented at baseline.    Assessment and Plan: 1. Flu-like symptoms (Primary) - oseltamivir (TAMIFLU) 75 MG capsule; Take 1 capsule (75 mg total) by mouth 2 (two) times daily.  Dispense: 10 capsule; Refill: 0 - benzonatate (TESSALON) 100 MG capsule; Take 1-2 capsules (100-200 mg total) by mouth 3 (three) times daily as needed.  Dispense: 30 capsule; Refill: 0 - promethazine-dextromethorphan (PROMETHAZINE-DM) 6.25-15 MG/5ML syrup; Take 5 mLs by mouth 4 (four) times daily as needed.  Dispense: 118 mL; Refill: 0 - albuterol (VENTOLIN HFA) 108 (90 Base) MCG/ACT inhaler; Inhale 1-2 puffs into the lungs every 6 (six) hours as needed.  Dispense: 8 g; Refill: 0  - Suspected viral URI with negative covid testing - Possible flu as flu A is prevalent in the community at this time - Limited testing availability that would delay appropriate treatment waiting on results - Tamiflu prescribed for possible flu - Promethazine DM and Tessalon for cough - Albuterol inhaler for chest  tightness, shortness of breath, and/or wheezing - Push fluids - Symptomatic management OTC of choice as needed - Seek in person evaluation if symptoms worsen or fail to improve   Follow Up Instructions: I discussed the assessment and treatment plan with the patient. The patient was provided an opportunity to ask questions and all were answered. The patient agreed with the plan and demonstrated an understanding of the instructions.  A copy of instructions were sent to the patient via MyChart unless otherwise noted below.    The patient was advised to call back or seek an in-person evaluation if the symptoms worsen or if the condition fails to improve as anticipated.    Margaretann Loveless, PA-C

## 2024-04-01 ENCOUNTER — Encounter: Payer: BC Managed Care – PPO | Admitting: Family Medicine

## 2024-04-02 ENCOUNTER — Other Ambulatory Visit: Payer: Self-pay | Admitting: Family Medicine

## 2024-04-02 DIAGNOSIS — M810 Age-related osteoporosis without current pathological fracture: Secondary | ICD-10-CM

## 2024-04-22 ENCOUNTER — Encounter: Payer: Self-pay | Admitting: Family Medicine

## 2024-04-22 ENCOUNTER — Ambulatory Visit (INDEPENDENT_AMBULATORY_CARE_PROVIDER_SITE_OTHER): Payer: BC Managed Care – PPO | Admitting: Family Medicine

## 2024-04-22 VITALS — BP 104/62 | HR 65 | Temp 97.9°F | Ht 62.5 in | Wt 124.5 lb

## 2024-04-22 DIAGNOSIS — Z23 Encounter for immunization: Secondary | ICD-10-CM

## 2024-04-22 DIAGNOSIS — D1801 Hemangioma of skin and subcutaneous tissue: Secondary | ICD-10-CM | POA: Insufficient documentation

## 2024-04-22 DIAGNOSIS — L814 Other melanin hyperpigmentation: Secondary | ICD-10-CM | POA: Insufficient documentation

## 2024-04-22 DIAGNOSIS — Z0000 Encounter for general adult medical examination without abnormal findings: Secondary | ICD-10-CM

## 2024-04-22 DIAGNOSIS — E782 Mixed hyperlipidemia: Secondary | ICD-10-CM | POA: Diagnosis not present

## 2024-04-22 DIAGNOSIS — D225 Melanocytic nevi of trunk: Secondary | ICD-10-CM | POA: Insufficient documentation

## 2024-04-22 DIAGNOSIS — L821 Other seborrheic keratosis: Secondary | ICD-10-CM | POA: Insufficient documentation

## 2024-04-22 LAB — HEPATIC FUNCTION PANEL
ALT: 23 U/L (ref 0–35)
AST: 29 U/L (ref 0–37)
Albumin: 4.6 g/dL (ref 3.5–5.2)
Alkaline Phosphatase: 65 U/L (ref 39–117)
Bilirubin, Direct: 0.2 mg/dL (ref 0.0–0.3)
Total Bilirubin: 1.1 mg/dL (ref 0.2–1.2)
Total Protein: 7.5 g/dL (ref 6.0–8.3)

## 2024-04-22 LAB — CBC WITH DIFFERENTIAL/PLATELET
Basophils Absolute: 0.1 10*3/uL (ref 0.0–0.1)
Basophils Relative: 1.1 % (ref 0.0–3.0)
Eosinophils Absolute: 0.1 10*3/uL (ref 0.0–0.7)
Eosinophils Relative: 2 % (ref 0.0–5.0)
HCT: 41.4 % (ref 36.0–46.0)
Hemoglobin: 13.8 g/dL (ref 12.0–15.0)
Lymphocytes Relative: 34.1 % (ref 12.0–46.0)
Lymphs Abs: 2 10*3/uL (ref 0.7–4.0)
MCHC: 33.4 g/dL (ref 30.0–36.0)
MCV: 92.6 fl (ref 78.0–100.0)
Monocytes Absolute: 0.6 10*3/uL (ref 0.1–1.0)
Monocytes Relative: 9.6 % (ref 3.0–12.0)
Neutro Abs: 3.1 10*3/uL (ref 1.4–7.7)
Neutrophils Relative %: 53.2 % (ref 43.0–77.0)
Platelets: 219 10*3/uL (ref 150.0–400.0)
RBC: 4.47 Mil/uL (ref 3.87–5.11)
RDW: 13.6 % (ref 11.5–15.5)
WBC: 5.7 10*3/uL (ref 4.0–10.5)

## 2024-04-22 LAB — LIPID PANEL
Cholesterol: 266 mg/dL — ABNORMAL HIGH (ref 0–200)
HDL: 80.1 mg/dL (ref >39.00)
LDL Cholesterol: 168 mg/dL — ABNORMAL HIGH (ref 0–99)
NonHDL: 186.03
Total CHOL/HDL Ratio: 3
Triglycerides: 92 mg/dL (ref 0.0–149.0)
VLDL: 18.4 mg/dL (ref 0.0–40.0)

## 2024-04-22 LAB — BASIC METABOLIC PANEL WITH GFR
BUN: 14 mg/dL (ref 6–23)
CO2: 27 meq/L (ref 19–32)
Calcium: 9.8 mg/dL (ref 8.4–10.5)
Chloride: 105 meq/L (ref 96–112)
Creatinine, Ser: 0.9 mg/dL (ref 0.40–1.20)
GFR: 67.5 mL/min (ref >60.00)
Glucose, Bld: 103 mg/dL — ABNORMAL HIGH (ref 70–99)
Potassium: 4.9 meq/L (ref 3.5–5.1)
Sodium: 142 meq/L (ref 135–145)

## 2024-04-22 LAB — TSH: TSH: 3.87 u[IU]/mL (ref 0.35–5.50)

## 2024-04-22 NOTE — Patient Instructions (Signed)
 Follow up in 1 year or as needed We'll notify you of your lab results and make any changes if needed Keep up the good work on healthy diet and regular exercise- you look great! Call with any questions or concerns Stay Safe!  Stay Healthy! Happy Retirement!!!

## 2024-04-22 NOTE — Assessment & Plan Note (Signed)
 Pt's PE WNL.  UTD on pap, mammo, colonoscopy, Tdap.  Prevnar given.  Check labs.  Anticipatory guidance provided.

## 2024-04-22 NOTE — Progress Notes (Signed)
   Subjective:    Patient ID: Allison Calderon, female    DOB: 07-18-1961, 63 y.o.   MRN: [id number]  HPI CPE- UTD on pap, mammo, colonoscopy, Tdap.  Due for Prevnar  Patient Care Team    Relationship Specialty Notifications Start End  Jess Morita, MD PCP - General Family Medicine  08/31/22   Melvena Stabler, NP Nurse Practitioner Obstetrics and Gynecology  12/14/12   Kenney Peacemaker, MD  Gastroenterology  12/24/13   Wolm Hawthorne, MD  Dermatology  12/24/13     Health Maintenance  Topic Date Due   INFLUENZA VACCINE  07/19/2024   Cervical Cancer Screening (HPV/Pap Cotest)  07/11/2025   MAMMOGRAM  08/07/2025   DTaP/Tdap/Td (4 - Td or Tdap) 02/09/2027   Colonoscopy  05/15/2028   Hepatitis C Screening  Completed   HIV Screening  Completed   Zoster Vaccines- Shingrix   Completed   HPV VACCINES  Aged Out   Meningococcal B Vaccine  Aged Out   COVID-19 Vaccine  Discontinued      Review of Systems Patient reports no vision/ hearing changes, adenopathy,fever, persistant/recurrent hoarseness , swallowing issues, chest pain, palpitations, edema, persistant/recurrent cough, hemoptysis, dyspnea (rest/exertional/paroxysmal nocturnal), gastrointestinal bleeding (melena, rectal bleeding), abdominal pain, significant heartburn, bowel changes, GU symptoms (dysuria, hematuria, incontinence), Gyn symptoms (abnormal  bleeding, pain),  syncope, focal weakness, memory loss, numbness & tingling, skin/hair/nail changes, abnormal bruising or bleeding, anxiety, or depression.   + 6 lb weight loss    Objective:   Physical Exam General Appearance:    Alert, cooperative, no distress, appears stated age  Head:    Normocephalic, without obvious abnormality, atraumatic  Eyes:    PERRL, conjunctiva/corneas clear, EOM's intact both eyes  Ears:    Normal TM's and external ear canals, both ears  Nose:   Nares normal, septum midline, mucosa normal, no drainage    or sinus tenderness  Throat:   Lips, mucosa, and  tongue normal; teeth and gums normal  Neck:   Supple, symmetrical, trachea midline, no adenopathy;    Thyroid : no enlargement/tenderness/nodules  Back:     Symmetric, no curvature, ROM normal, no CVA tenderness  Lungs:     Clear to auscultation bilaterally, respirations unlabored  Chest Wall:    No tenderness or deformity   Heart:    Regular rate and rhythm, S1 and S2 normal, no murmur, rub   or gallop  Breast Exam:    Deferred to GYN  Abdomen:     Soft, non-tender, bowel sounds active all four quadrants,    no masses, no organomegaly  Genitalia:    Deferred to GYN  Rectal:    Extremities:   Extremities normal, atraumatic, no cyanosis or edema  Pulses:   2+ and symmetric all extremities  Skin:   Skin color, texture, turgor normal, no rashes or lesions  Lymph nodes:   Cervical, supraclavicular, and axillary nodes normal  Neurologic:   CNII-XII intact, normal strength, sensation and reflexes    throughout          Assessment & Plan:

## 2024-04-23 ENCOUNTER — Encounter: Payer: Self-pay | Admitting: Family Medicine

## 2024-04-23 ENCOUNTER — Telehealth: Payer: Self-pay

## 2024-04-23 NOTE — Telephone Encounter (Signed)
 Pt has reviewed via MyChart

## 2024-04-23 NOTE — Telephone Encounter (Signed)
-----   Message from Laymon Priest sent at 04/23/2024  7:33 AM EDT ----- Labs look good!  Your total cholesterol and LDL (bad cholesterol) are both better than last year.  Keep up the good work on healthy diet and regular exercise.  No need for medication at this time, but you could add OTC Red Yeast Rice supplement daily to improve your LDL number.

## 2024-07-03 ENCOUNTER — Other Ambulatory Visit: Payer: Self-pay | Admitting: Family Medicine

## 2024-07-03 DIAGNOSIS — Z1231 Encounter for screening mammogram for malignant neoplasm of breast: Secondary | ICD-10-CM

## 2024-07-29 ENCOUNTER — Other Ambulatory Visit: Payer: Self-pay | Admitting: Family Medicine

## 2024-07-29 DIAGNOSIS — M810 Age-related osteoporosis without current pathological fracture: Secondary | ICD-10-CM

## 2024-07-29 NOTE — Telephone Encounter (Signed)
 Patient needs an appt will call tomorrow

## 2024-07-30 ENCOUNTER — Telehealth: Payer: Self-pay

## 2024-07-30 NOTE — Telephone Encounter (Signed)
 Copied from CRM [id number]. Topic: General - Call Back - No Documentation >> Jul 30, 2024  2:20 PM Allison Calderon wrote: Reason for CRM: Patient is calling [name] back, she is confused why she would need an appointment, as the medication was refilled and she recently had a physical. If the patient does an appointment for future refills she would like a call back to go over this.

## 2024-07-30 NOTE — Telephone Encounter (Signed)
 LM to call back to get an appt scheduled as she is overdue, then can continue refills

## 2024-07-30 NOTE — Telephone Encounter (Signed)
 Reviewed by provider and appts up to date. No appt required

## 2024-08-05 DIAGNOSIS — L820 Inflamed seborrheic keratosis: Secondary | ICD-10-CM | POA: Diagnosis not present

## 2024-08-05 DIAGNOSIS — L578 Other skin changes due to chronic exposure to nonionizing radiation: Secondary | ICD-10-CM | POA: Diagnosis not present

## 2024-08-05 DIAGNOSIS — D485 Neoplasm of uncertain behavior of skin: Secondary | ICD-10-CM | POA: Diagnosis not present

## 2024-08-05 DIAGNOSIS — L814 Other melanin hyperpigmentation: Secondary | ICD-10-CM | POA: Diagnosis not present

## 2024-08-05 DIAGNOSIS — D045 Carcinoma in situ of skin of trunk: Secondary | ICD-10-CM | POA: Diagnosis not present

## 2024-08-05 DIAGNOSIS — D225 Melanocytic nevi of trunk: Secondary | ICD-10-CM | POA: Diagnosis not present

## 2024-08-05 DIAGNOSIS — L821 Other seborrheic keratosis: Secondary | ICD-10-CM | POA: Diagnosis not present

## 2024-08-05 DIAGNOSIS — L570 Actinic keratosis: Secondary | ICD-10-CM | POA: Diagnosis not present

## 2024-08-12 ENCOUNTER — Ambulatory Visit
Admission: RE | Admit: 2024-08-12 | Discharge: 2024-08-12 | Disposition: A | Discharge status: Home / Self Care | Source: Ambulatory Visit

## 2024-08-12 DIAGNOSIS — Z1231 Encounter for screening mammogram for malignant neoplasm of breast: Secondary | ICD-10-CM

## 2024-08-13 ENCOUNTER — Encounter: Payer: Self-pay | Admitting: Family Medicine

## 2024-08-26 DIAGNOSIS — D045 Carcinoma in situ of skin of trunk: Secondary | ICD-10-CM | POA: Diagnosis not present

## 2024-09-02 ENCOUNTER — Ambulatory Visit (INDEPENDENT_AMBULATORY_CARE_PROVIDER_SITE_OTHER)

## 2024-09-02 DIAGNOSIS — Z23 Encounter for immunization: Secondary | ICD-10-CM | POA: Diagnosis not present

## 2024-09-02 NOTE — Progress Notes (Signed)
 SEE Allison Calderon is a 63 y.o. female presents to the office today for FLU Vaccine per physician's orders. Injection was administered Intramuscular Left deltoid.   Patient's next injection due n/a, appt made? not applicable  [employer]

## 2024-10-22 ENCOUNTER — Other Ambulatory Visit: Payer: Self-pay | Admitting: Family Medicine

## 2024-10-22 DIAGNOSIS — M810 Age-related osteoporosis without current pathological fracture: Secondary | ICD-10-CM

## 2024-12-20 ENCOUNTER — Encounter: Payer: Self-pay | Admitting: Family Medicine

## 2024-12-20 NOTE — Telephone Encounter (Signed)
 I am still unsure about scheduling these. We have someone that does AWV's right? I am not sure the steps I need to take. I want to make sure I tell her the right information before I message her back.

## 2024-12-24 NOTE — Telephone Encounter (Signed)
 Sent message to front desk to schedule

## 2025-01-09 ENCOUNTER — Ambulatory Visit

## 2025-01-10 ENCOUNTER — Encounter: Admitting: Family Medicine

## 2025-01-15 ENCOUNTER — Other Ambulatory Visit: Payer: Self-pay | Admitting: Family Medicine

## 2025-01-15 DIAGNOSIS — M810 Age-related osteoporosis without current pathological fracture: Secondary | ICD-10-CM

## 2025-01-17 ENCOUNTER — Ambulatory Visit: Admitting: Family Medicine

## 2025-02-20 ENCOUNTER — Encounter: Admitting: Family Medicine
